# Patient Record
Sex: Female | Born: 1935 | Race: White | Hispanic: No | Marital: Married
Health system: Southern US, Community
[De-identification: ages and names within clinical notes are randomized; demographics above are authoritative.]

## PROBLEM LIST (undated history)

## (undated) DIAGNOSIS — M069 Rheumatoid arthritis, unspecified: Secondary | ICD-10-CM

---

## 1998-11-08 ENCOUNTER — Ambulatory Visit (HOSPITAL_COMMUNITY): Admission: RE | Admit: 1998-11-08 | Discharge: 1998-11-08 | Payer: Self-pay | Admitting: Specialist

## 2000-08-11 ENCOUNTER — Ambulatory Visit (HOSPITAL_COMMUNITY): Admission: RE | Admit: 2000-08-11 | Discharge: 2000-08-11 | Payer: Self-pay | Admitting: Specialist

## 2001-11-11 ENCOUNTER — Encounter
Admission: RE | Admit: 2001-11-11 | Discharge: 2001-12-10 | Payer: Self-pay | Admitting: Physical Medicine and Rehabilitation

## 2005-09-18 ENCOUNTER — Ambulatory Visit: Payer: Self-pay | Admitting: Infectious Diseases

## 2005-09-24 ENCOUNTER — Inpatient Hospital Stay (HOSPITAL_COMMUNITY): Admission: AD | Admit: 2005-09-24 | Discharge: 2005-09-29 | Payer: Self-pay | Admitting: Internal Medicine

## 2009-11-17 ENCOUNTER — Ambulatory Visit (HOSPITAL_COMMUNITY): Admission: RE | Admit: 2009-11-17 | Discharge: 2009-11-17 | Payer: Self-pay | Admitting: Cardiovascular Disease

## 2009-11-17 ENCOUNTER — Ambulatory Visit: Payer: Self-pay | Admitting: Cardiovascular Disease

## 2009-11-21 ENCOUNTER — Ambulatory Visit (HOSPITAL_COMMUNITY)
Admission: RE | Admit: 2009-11-21 | Discharge: 2009-11-21 | Payer: PRIVATE HEALTH INSURANCE | Admitting: Internal Medicine

## 2010-08-24 NOTE — H&P (Signed)
NAMEJAYDALEE, [name]            ACCOUNT NO.:  [id number]   MEDICAL RECORD NO.:  [id number]          PATIENT TYPE:  INP   LOCATION:  3010                         FACILITY:  [facility]   PHYSICIAN:  Larina Earthly, M.D.        DATE OF BIRTH:  November 23, 1929   DATE OF ADMISSION:  09/24/2005  DATE OF DISCHARGE:                                HISTORY & PHYSICAL   CHIEF COMPLAINT:  Left forehead blister associated with significant  erythema, fevers, chills, anorexia and decreased urine output.   HISTORY OF PRESENT ILLNESS:  This is a 77 year old Caucasian female who has  a history of low-dose prednisone dependent rheumatoid arthritis, ongoing  tobacco abuse, gastroesophageal reflux disease, depression, osteoporosis,  hypertension and a recent urinary tract infection growing out Salmonella  group E.  For the latter, she was treated with Cipro 250 mg p.o. b.i.d. x14  days starting on September 12, 2005.  The latter was also discussed with Dr. Maurice March  who felt that she was most probably colonized in her GI tract with the  Salmonella in that there was no need at this time to report this finding to  the local Health Department given that it was not a GI pathogens at this  point.   On September 20, 2005, the patient noted a blister on the left forehead without  any discomfort but continued to watch this lesion.  She felt that it may be  related to the previously mentioned Cipro and this was discontinued.  Over  the next 2-3 days, this was followed by advancing erythema over the left  forehead with associated with subjective chills, malaise, anorexia and  decreased urine output despite adequate fluid intake.  Symptoms progressed  on June 17 and June 18 such that the erythema covered half of the left side  of the forehead and the tissue surrounding the left orbit with some  irritation, decreased vision on the left side.  The patient was offered an  office visit by our practice on September 23, 2005 but the patient  declined.  Today, the patient was seen as a work-in evaluation where she was claimed  that she was quite weak from the anorexia, despite her water intake and she  continued to have some subjective chills, albeit none in the last 12-20  hours.  Given the severity of her erythema, which was not well demarcated  nor did it have the classic appearance of shingles at the time, this was  discussed at length with Dr. Randon Goldsmith partner, Dr. Dillon Bjork who recommended  Ciloxan ointment  as well as Valtrex treatment orally and close follow up in  his office to involve a slit lamp evaluation.  He did prefer to see the  patient on an outpatient basis as opposed to in the hospital given the lack  of proper equipment in the hospital setting.  Given that the patient is  elderly, lives with her husband who is quite frail and her complaints of  anorexia and decreased urine output, I opted to admit the patient for IV  antibiotics as well as antiviral therapy with close follow-up  in evaluation.  I did discuss the patient with Dr. Maurice March who also agreed with starting broad-  spectrum antibiotics to include Unasyn as well as vancomycin just in case  this is community-acquired MRSA.   REVIEW OF SYSTEMS:  As above.   PROBLEM LIST:  1.  Rheumatoid arthritis for approximately 20 years with significant      involvement of the hands, knees and now spinal stenosis related to      underlying degenerative joint disease.  She had deferred demargination-      type therapy for numerous years and indeed after seeing two different      rheumatologist within [facility] is currently on prednisone 5 mg each      day with adequate decreases in her active synovitis.  2.  Head tremor, chronic.  3.  Ongoing tobacco abuse with chronic obstructive pulmonary disease by      chest radiological evaluation.  4.  Gastroesophageal reflux disease with a history of H.  Pylori treatment.  5.  Cataracts treated by Dr. Hazle Quant.  6.  Depression.   7.  History of total abdominal hysterectomy.  8.  Osteoporosis.  9.  Hypertension.  10. Spinal stenosis.   SOCIAL HISTORY:  The patient is married for approximately 52 years, has two  grandchildren and one deceased child as well as two supportive nieces.  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  Has no significant use of alcohol.   FAMILY HISTORY:  Father died at the age of 18 of a brain disorder.  Mother  died at the age of 1 of Parkinson's disease.  The patient does have a  sister who is alive and well.   CURRENT MEDICATIONS:  1.  Restoril 15 mg one p.o. q.h.s.  2.  Effexor XR 75 mg p.o. q.a.m.  3.  Prednisone 5 mg p.o. q.a.m.  4.  Hydrochlorothiazide 25 mg p.o. daily.  5.  Xanax 0.5 mg p.o. b.i.d.  6.  Zantac 150 mg p.o. b.i.d.  7.  Benicar/HCT 40/12.5 1 p.o. daily.   ALLERGIES:  1.  The patient has allergies to PENICILLIN resulting in skin reactions.  2.  ALEVE resulting in hives.  3.  PROZAC resulting in hives.  4.  AVELOX resulting in anxiety.  5.  LEVAQUIN being tolerated.   LABORATORY EVALUATION:  Pending are blood cultures x2, urinalysis with  culture.  If indicated CMET and CBC.   PHYSICAL EXAM:  In general, we have a Caucasian female sitting in a  wheelchair answering all questions appropriately.  Alert and oriented x3 in  no respiratory distress.  Blood pressure 110/68, pulse 56.  The patient is  afebrile.  Respirations were 18 and nonlabored.  Sclerae anicteric with some  mild conjunctivitis on the left.  There is a significant periorbital  swelling of the soft tissues.  Extraocular movements are intact.  There is  significant erythema extending over the whole left side of the forehead but  not into the scalp area which is quite confluent without patches and there is one large ulcer status post blister formation in the  middle of the left  forehead with yellow crust that is significant.  There is no active  discharge at this time.  There is no oropharyngeal lesions.  Tympanic  membranes are clear bilaterally.  Neck is supple.  There is no cervical  lymphadenopathy.  Lungs are clear to auscultation bilaterally.  Cardiovascular exam reveals a regular rate and rhythm.  There is no axillary  lymphadenopathy.  Abdominal exam reveals a soft, nontender, nondistended  abdomen.  Bowel sounds are present.  Extremity exam reveals trace edema.  Pedal pulses are intact.  Musculoskeletal exam reveals significant  osteoarthritic as well as rheumatoid arthritic-type changes with no active  synovitis.   ASSESSMENT/PLAN:  1.  Left facial cellulitis with associated ulcer/blister and periocular      erythema and swelling:  Discussed both with Dr. Dillon Bjork and Dr. Maurice March.  We      will start broad-spectrum antibiotics to include vancomycin as well as      antiviral therapy as outlined above.  We will follow up on cultures and      sensitivities.  Dr. Dillon Bjork will consult within the hospital if indicated      as stated above.  Dr. Maurice March will also consult and make appropriate      recommendations.  2.  Rheumatoid arthritis that is prednisone dependent:  We will monitor      hemodynamic status and use stress dose steroids if the patient becomes      hemodynamically unstable.  3.  History of Salmonella urinary tract infection asymptomatic at this time      with decreased urine output is concerning:  We will check urinalysis and      evaluate renal parameters.  We will provide IV fluids and monitor      appropriately.  4.  Hypertension:  We will continue angiotensin receptive blockade and hold      diuretics including hydrochlorothiazide.  5.  Gastroesophageal reflux disease with continue proton pump inhibitor.  6.  Tobacco abuse:  Discussed with the patient and may need nicotine patch      if indicated.      Larina Earthly, M.D.   Electronically Signed     RA/MEDQ  D:  09/24/2005  T:  09/24/2005  Job:  [id number]   cc:   Fransisco Hertz, M.D.  Fax: [phone]   Lilian Kapur. Dillon Bjork, M.D.  Fax: [phone]

## 2010-08-24 NOTE — Discharge Summary (Signed)
[name], [name]            ACCOUNT NO.:  [id number]   MEDICAL RECORD NO.:  [id number]          PATIENT TYPE:  INP   LOCATION:  3010                         FACILITY:  [facility]   PHYSICIAN:  Larina Earthly, M.D.        DATE OF BIRTH:  Jan 31, 1930   DATE OF ADMISSION:  09/24/2005  DATE OF DISCHARGE:  09/29/2005                                 DISCHARGE SUMMARY   DISCHARGE DIAGNOSES:  1. Left facial cellulitis primarily involving the forehead and periorbital      region possibly secondary to zoster with secondary infection without      systemic spread treated with vancomycin, Valtrex, __________  ointment      and Unasyn, but progressive improvement during hospitalization and      transferred to oral antibiotics.  2. Periorbital swelling secondary to #1 with outpatient follow-up of the      ocular issues by Dr. Dillon Bjork who is an associate of Dr. Hazle Quant.  3. Urinary retention with increased postvoid residual initiated on Flomax      and followed by Dr. Brunilda Payor with resolution of major symptoms prior to      discharge and discontinuation of Foley.  4. Hypertension, controlled.  5. Anorexia, improved with correction of electrolytes and on a proton-pump      inhibitor.  6. Electrolyte abnormalities on admission, resolved with Foley placement      and relief of urinary retention.  7. Anxiety disorder, stable.   SECONDARY DIAGNOSES:  1. Rheumatoid arthritis.  2. Gastroesophageal reflux disease.  3. Depression.   DISCHARGE MEDICATIONS:  1. Flomax 0.4 mg p.o. every evening.  2. Valtrex 1000 mg three times a day to complete a seven-day course.  3. Ciloxan eye drops one drop four times a day for seven days.  4. Doxycycline 100 mg b.i.d. for 10 days.  5. Restoril 15 mg each evening.  6. Effexor XR 75 mg each day.  7. Prednisone 5 mg each day.  8. Hydrochlorothiazide 25 mg daily.  9. Xanax 0.5 mg b.i.d.  10.Zantac 150 mg p.o. b.i.d.  11.Benicar/HCT 40/12.5 once daily.   LABORATORY  EVALUATION:  On admission the white blood cell count was 8.5 and  on discharge was 9.3, hemoglobin 12.7 decreasing to 10.6 with IV fluids,  platelet counts in the 200s.  Admission sodium was 118 with potassium of  3.1, the BUN 6, creatinine 0.6, and liver function tests being normal.  On  discharge sodium was 132, potassium 3.8, BUN 6, creatinine 0.7.  Urinalysis  with potential issues with infection with large amount of leukocyte  esterase, but many epithelial cells and many bacteria as well as leukorrhea.  Urine culture did grow out multiple bacterial morphotypes with none  predominant.  Blood cultures x2 were unremarkable obtained on admission.  The patient on discharge was instructed to follow up with Dr. Felipa Eth in  approximately one week and Dr. Vernie Ammons in approximately one week and to call  if problems.   HISTORY OF THE PRESENT ILLNESS:  This is a 77 year old Caucasian female with  the above-mentioned problem list on low-dose prednisone for rheumatoid  arthritis and ongoing tobacco abuse who presented with a left forehead  blister associated with significant erythema, fevers, chills, anorexia, and  decreased urine output.  She was subsequently admitted after consultation or  discussion with infectious disease as well as ophthalmology given the  clinical diagnosis of possible zoster and left facial cellulitis.  She was  placed on broad-spectrum antibiotics and antiviral agents as discussed above  and in the history and physical and admitted for further evaluation and  management.   HOSPITAL COURSE:  The patient quickly improved over the next two to three  days with improvement in the appearance of her left facial cellulitis.  She  tolerated her IV antibiotics well and her visual complaints thus slightly  improved gradually.  Her electrolytes resolved with IV fluids and placement  of a Foley catheter for urine retention.  She was seen by urology who  recommended initiating Flomax with  gradual resolution of her bladder  retention symptoms.  She was transferred over to oral doxycycline during her  hospitalization as well as completed Valtrex treatment and was thought  appropriate for discharge on September 29, 2005.      Larina Earthly, M.D.  Electronically Signed     RA/MEDQ  D:  11/14/2005  T:  11/14/2005  Job:  [id number]

## 2010-08-24 NOTE — Consult Note (Signed)
[name], [name]            ACCOUNT NO.:  [id number]   MEDICAL RECORD NO.:  [id number]          PATIENT TYPE:  INP   LOCATION:  3010                         FACILITY:  [facility]   PHYSICIAN:  Mark C. Vernie Ammons, M.D.  DATE OF BIRTH:  [birth year]   DATE OF CONSULTATION:  09/26/2005  DATE OF DISCHARGE:                                   CONSULTATION   ROOM NUMBER:  [id number].   HISTORY OF PRESENT ILLNESS:  The patient is a 77 year old white female seen  in hospital consultation for acute urinary retention. She reports having had  what sounds like some voiding difficulty for several months initially,  consisting of dysuria, frequency, and urgency, but only voiding a small  amount at a time. She did report having hesitancy when she did attempt to  void and would push on her lower abdomen in order to attempt to empty her  bladder. She felt like she was not completely emptying her bladder. She has  no history of stones or hematuria. She reports she was put in the hospital  and underwent cystoscopy for bladder problems that she does not know the  nature of, when she was very young. She also gives a history of being  treated recently for a urinary tract infection. Prior to [facility], there  had been what appeared to be decreased urine output.   PAST MEDICAL HISTORY:  1.  Rheumatoid arthritis.  2.  Chronic head tremor.  3.  Tobacco abuse.  4.  Chronic obstructive pulmonary disease.  5.  Gastroesophageal reflux.  6.  Cataracts.  7.  Depression.  8.  Osteoporosis.  9.  Hypertension.  10. Spinal stenosis.   PAST SURGICAL HISTORY:  She had a hysterectomy and has had breast surgery.   ALLERGIES:  PENICILLIN, ALEVE, PROZAC, AVELOX, LEVAQUIN.   MEDICATIONS:  Restoril, Effexor, prednisone, hydrochlorothiazide, Xanax,  Zantac, Benicar/HCTZ.   SOCIAL HISTORY:  The patient smokes a pack and a half of cigarettes a day  for approximately 50 years. Denies any significant alcohol use.   FAMILY  HISTORY:  Father died at 38 of a brain disorder. Mother died at 1 of  Parkinson's.   REVIEW OF SYSTEMS:  Per health history section 2, which was reviewed and  signed.   PHYSICAL EXAMINATION:  GENERAL:  The patient is an elderly white female with  an obvious head tremor.  HEENT:  Normocephalic and atraumatic. Oropharynx clear. She does have what  appears to be some mild swelling and erythema in the periorbital region and  supraorbital region on the left hand side.  VITAL SIGNS:  Blood pressure is 137/69, pulse 76, temperature 97.4.  NECK:  Supple with midline trachea.  CHEST:  Normal inspiratory effort.  CARDIOVASCULAR:  Regular rate and rhythm.  ABDOMEN:  Soft, nontender, with mass or HSM.  GENITOURINARY:  Normal external female genitalia with a Foley catheter  indwelling.  RECTAL:  She has normal anus and perineum.  EXTREMITIES:  Without clubbing, cyanosis, or edema.  NEUROLOGIC:  Alert and oriented with appropriate mood and affect.   LABORATORY DATA:  Creatinine 0.7. Urinalysis on September 24, 2005 with  positive  for leukocyte esterase and had 11 to 20 white blood cells and no red blood  cells and many bacteria. On September 25, 2005, urinalysis was repeated and was  completely negative. A urine culture was obtained but remains pending at  this time.   IMPRESSION:  Long standing voiding dysfunction by the patient's history and  what sounds like a recent worsening with resultant urinary retention and  possibly urinary tract infection.   RECOMMENDATIONS:  1.  Continue Foley catheter drainage for now.  2.  Await urine culture results.  3.  Begin enteric Flomax 0.4 mg.  4.  Voiding trial in 48 hours.  5.  Once she is voiding spontaneously without a Foley catheter, I will need      to do Urodynamics in my office, as an outpatient.      Mark C. Vernie Ammons, M.D.  Electronically Signed     MCO/MEDQ  D:  09/26/2005  T:  09/27/2005  Job:  [id number]

## 2011-08-26 DIAGNOSIS — R259 Unspecified abnormal involuntary movements: Secondary | ICD-10-CM | POA: Diagnosis not present

## 2011-08-26 DIAGNOSIS — M069 Rheumatoid arthritis, unspecified: Secondary | ICD-10-CM | POA: Diagnosis not present

## 2011-08-26 DIAGNOSIS — M810 Age-related osteoporosis without current pathological fracture: Secondary | ICD-10-CM | POA: Diagnosis not present

## 2011-08-26 DIAGNOSIS — I10 Essential (primary) hypertension: Secondary | ICD-10-CM | POA: Diagnosis not present

## 2012-02-26 DIAGNOSIS — I10 Essential (primary) hypertension: Secondary | ICD-10-CM | POA: Diagnosis not present

## 2012-02-26 DIAGNOSIS — Z23 Encounter for immunization: Secondary | ICD-10-CM | POA: Diagnosis not present

## 2012-02-26 DIAGNOSIS — Z1331 Encounter for screening for depression: Secondary | ICD-10-CM | POA: Diagnosis not present

## 2012-02-26 DIAGNOSIS — M069 Rheumatoid arthritis, unspecified: Secondary | ICD-10-CM | POA: Diagnosis not present

## 2012-02-26 DIAGNOSIS — M810 Age-related osteoporosis without current pathological fracture: Secondary | ICD-10-CM | POA: Diagnosis not present

## 2012-02-26 DIAGNOSIS — F329 Major depressive disorder, single episode, unspecified: Secondary | ICD-10-CM | POA: Diagnosis not present

## 2012-08-25 DIAGNOSIS — K5900 Constipation, unspecified: Secondary | ICD-10-CM | POA: Diagnosis not present

## 2012-08-25 DIAGNOSIS — R259 Unspecified abnormal involuntary movements: Secondary | ICD-10-CM | POA: Diagnosis not present

## 2012-08-25 DIAGNOSIS — I10 Essential (primary) hypertension: Secondary | ICD-10-CM | POA: Diagnosis not present

## 2012-08-25 DIAGNOSIS — M810 Age-related osteoporosis without current pathological fracture: Secondary | ICD-10-CM | POA: Diagnosis not present

## 2012-08-25 DIAGNOSIS — F329 Major depressive disorder, single episode, unspecified: Secondary | ICD-10-CM | POA: Diagnosis not present

## 2012-08-25 DIAGNOSIS — M069 Rheumatoid arthritis, unspecified: Secondary | ICD-10-CM | POA: Diagnosis not present

## 2012-08-25 DIAGNOSIS — R7301 Impaired fasting glucose: Secondary | ICD-10-CM | POA: Diagnosis not present

## 2012-08-25 DIAGNOSIS — F17200 Nicotine dependence, unspecified, uncomplicated: Secondary | ICD-10-CM | POA: Diagnosis not present

## 2013-02-22 DIAGNOSIS — M069 Rheumatoid arthritis, unspecified: Secondary | ICD-10-CM | POA: Diagnosis not present

## 2013-02-22 DIAGNOSIS — Z6830 Body mass index (BMI) 30.0-30.9, adult: Secondary | ICD-10-CM | POA: Diagnosis not present

## 2013-02-22 DIAGNOSIS — F17200 Nicotine dependence, unspecified, uncomplicated: Secondary | ICD-10-CM | POA: Diagnosis not present

## 2013-02-22 DIAGNOSIS — I10 Essential (primary) hypertension: Secondary | ICD-10-CM | POA: Diagnosis not present

## 2013-02-22 DIAGNOSIS — M1990 Unspecified osteoarthritis, unspecified site: Secondary | ICD-10-CM | POA: Diagnosis not present

## 2013-02-22 DIAGNOSIS — J069 Acute upper respiratory infection, unspecified: Secondary | ICD-10-CM | POA: Diagnosis not present

## 2013-02-22 DIAGNOSIS — Z1331 Encounter for screening for depression: Secondary | ICD-10-CM | POA: Diagnosis not present

## 2013-02-22 DIAGNOSIS — R7301 Impaired fasting glucose: Secondary | ICD-10-CM | POA: Diagnosis not present

## 2013-08-23 DIAGNOSIS — K219 Gastro-esophageal reflux disease without esophagitis: Secondary | ICD-10-CM | POA: Diagnosis not present

## 2013-08-23 DIAGNOSIS — F3289 Other specified depressive episodes: Secondary | ICD-10-CM | POA: Diagnosis not present

## 2013-08-23 DIAGNOSIS — M810 Age-related osteoporosis without current pathological fracture: Secondary | ICD-10-CM | POA: Diagnosis not present

## 2013-08-23 DIAGNOSIS — M1990 Unspecified osteoarthritis, unspecified site: Secondary | ICD-10-CM | POA: Diagnosis not present

## 2013-08-23 DIAGNOSIS — K5900 Constipation, unspecified: Secondary | ICD-10-CM | POA: Diagnosis not present

## 2013-08-23 DIAGNOSIS — M069 Rheumatoid arthritis, unspecified: Secondary | ICD-10-CM | POA: Diagnosis not present

## 2013-08-23 DIAGNOSIS — F329 Major depressive disorder, single episode, unspecified: Secondary | ICD-10-CM | POA: Diagnosis not present

## 2013-08-23 DIAGNOSIS — R7301 Impaired fasting glucose: Secondary | ICD-10-CM | POA: Diagnosis not present

## 2013-08-23 DIAGNOSIS — R259 Unspecified abnormal involuntary movements: Secondary | ICD-10-CM | POA: Diagnosis not present

## 2013-08-23 DIAGNOSIS — I10 Essential (primary) hypertension: Secondary | ICD-10-CM | POA: Diagnosis not present

## 2013-08-23 DIAGNOSIS — Z1331 Encounter for screening for depression: Secondary | ICD-10-CM | POA: Diagnosis not present

## 2014-02-23 DIAGNOSIS — R7301 Impaired fasting glucose: Secondary | ICD-10-CM | POA: Diagnosis not present

## 2014-02-23 DIAGNOSIS — I10 Essential (primary) hypertension: Secondary | ICD-10-CM | POA: Diagnosis not present

## 2014-02-23 DIAGNOSIS — F17200 Nicotine dependence, unspecified, uncomplicated: Secondary | ICD-10-CM | POA: Diagnosis not present

## 2014-02-23 DIAGNOSIS — K219 Gastro-esophageal reflux disease without esophagitis: Secondary | ICD-10-CM | POA: Diagnosis not present

## 2014-02-23 DIAGNOSIS — M069 Rheumatoid arthritis, unspecified: Secondary | ICD-10-CM | POA: Diagnosis not present

## 2014-02-23 DIAGNOSIS — M859 Disorder of bone density and structure, unspecified: Secondary | ICD-10-CM | POA: Diagnosis not present

## 2014-02-23 DIAGNOSIS — R259 Unspecified abnormal involuntary movements: Secondary | ICD-10-CM | POA: Diagnosis not present

## 2014-02-23 DIAGNOSIS — Z23 Encounter for immunization: Secondary | ICD-10-CM | POA: Diagnosis not present

## 2014-02-23 DIAGNOSIS — F329 Major depressive disorder, single episode, unspecified: Secondary | ICD-10-CM | POA: Diagnosis not present

## 2014-02-23 DIAGNOSIS — Z1389 Encounter for screening for other disorder: Secondary | ICD-10-CM | POA: Diagnosis not present

## 2015-01-10 DIAGNOSIS — Z23 Encounter for immunization: Secondary | ICD-10-CM | POA: Diagnosis not present

## 2015-01-10 DIAGNOSIS — M810 Age-related osteoporosis without current pathological fracture: Secondary | ICD-10-CM | POA: Diagnosis not present

## 2015-01-10 DIAGNOSIS — K219 Gastro-esophageal reflux disease without esophagitis: Secondary | ICD-10-CM | POA: Diagnosis not present

## 2015-01-10 DIAGNOSIS — Z6830 Body mass index (BMI) 30.0-30.9, adult: Secondary | ICD-10-CM | POA: Diagnosis not present

## 2015-01-10 DIAGNOSIS — R7301 Impaired fasting glucose: Secondary | ICD-10-CM | POA: Diagnosis not present

## 2015-01-10 DIAGNOSIS — F329 Major depressive disorder, single episode, unspecified: Secondary | ICD-10-CM | POA: Diagnosis not present

## 2015-01-10 DIAGNOSIS — I10 Essential (primary) hypertension: Secondary | ICD-10-CM | POA: Diagnosis not present

## 2015-01-10 DIAGNOSIS — M069 Rheumatoid arthritis, unspecified: Secondary | ICD-10-CM | POA: Diagnosis not present

## 2015-01-10 DIAGNOSIS — M1990 Unspecified osteoarthritis, unspecified site: Secondary | ICD-10-CM | POA: Diagnosis not present

## 2015-01-10 DIAGNOSIS — K5900 Constipation, unspecified: Secondary | ICD-10-CM | POA: Diagnosis not present

## 2015-07-11 DIAGNOSIS — R7301 Impaired fasting glucose: Secondary | ICD-10-CM | POA: Diagnosis not present

## 2015-07-11 DIAGNOSIS — F329 Major depressive disorder, single episode, unspecified: Secondary | ICD-10-CM | POA: Diagnosis not present

## 2015-07-11 DIAGNOSIS — I10 Essential (primary) hypertension: Secondary | ICD-10-CM | POA: Diagnosis not present

## 2015-07-11 DIAGNOSIS — M810 Age-related osteoporosis without current pathological fracture: Secondary | ICD-10-CM | POA: Diagnosis not present

## 2015-07-11 DIAGNOSIS — M069 Rheumatoid arthritis, unspecified: Secondary | ICD-10-CM | POA: Diagnosis not present

## 2015-07-11 DIAGNOSIS — K5900 Constipation, unspecified: Secondary | ICD-10-CM | POA: Diagnosis not present

## 2015-07-11 DIAGNOSIS — Z6830 Body mass index (BMI) 30.0-30.9, adult: Secondary | ICD-10-CM | POA: Diagnosis not present

## 2015-07-11 DIAGNOSIS — K219 Gastro-esophageal reflux disease without esophagitis: Secondary | ICD-10-CM | POA: Diagnosis not present

## 2015-10-25 DIAGNOSIS — Z6830 Body mass index (BMI) 30.0-30.9, adult: Secondary | ICD-10-CM | POA: Diagnosis not present

## 2015-10-25 DIAGNOSIS — M069 Rheumatoid arthritis, unspecified: Secondary | ICD-10-CM | POA: Diagnosis not present

## 2015-10-25 DIAGNOSIS — I10 Essential (primary) hypertension: Secondary | ICD-10-CM | POA: Diagnosis not present

## 2015-10-25 DIAGNOSIS — R300 Dysuria: Secondary | ICD-10-CM | POA: Diagnosis not present

## 2015-10-25 DIAGNOSIS — R7301 Impaired fasting glucose: Secondary | ICD-10-CM | POA: Diagnosis not present

## 2015-10-25 DIAGNOSIS — F329 Major depressive disorder, single episode, unspecified: Secondary | ICD-10-CM | POA: Diagnosis not present

## 2016-02-26 DIAGNOSIS — Z23 Encounter for immunization: Secondary | ICD-10-CM | POA: Diagnosis not present

## 2016-02-26 DIAGNOSIS — I10 Essential (primary) hypertension: Secondary | ICD-10-CM | POA: Diagnosis not present

## 2016-02-26 DIAGNOSIS — F17200 Nicotine dependence, unspecified, uncomplicated: Secondary | ICD-10-CM | POA: Diagnosis not present

## 2016-02-26 DIAGNOSIS — M810 Age-related osteoporosis without current pathological fracture: Secondary | ICD-10-CM | POA: Diagnosis not present

## 2016-02-26 DIAGNOSIS — R7301 Impaired fasting glucose: Secondary | ICD-10-CM | POA: Diagnosis not present

## 2016-02-26 DIAGNOSIS — M0689 Other specified rheumatoid arthritis, multiple sites: Secondary | ICD-10-CM | POA: Diagnosis not present

## 2016-02-26 DIAGNOSIS — Z6829 Body mass index (BMI) 29.0-29.9, adult: Secondary | ICD-10-CM | POA: Diagnosis not present

## 2016-02-26 DIAGNOSIS — K219 Gastro-esophageal reflux disease without esophagitis: Secondary | ICD-10-CM | POA: Diagnosis not present

## 2016-02-26 DIAGNOSIS — F329 Major depressive disorder, single episode, unspecified: Secondary | ICD-10-CM | POA: Diagnosis not present

## 2016-11-06 DIAGNOSIS — Z6828 Body mass index (BMI) 28.0-28.9, adult: Secondary | ICD-10-CM | POA: Diagnosis not present

## 2016-11-06 DIAGNOSIS — M069 Rheumatoid arthritis, unspecified: Secondary | ICD-10-CM | POA: Diagnosis not present

## 2016-11-06 DIAGNOSIS — K219 Gastro-esophageal reflux disease without esophagitis: Secondary | ICD-10-CM | POA: Diagnosis not present

## 2016-11-06 DIAGNOSIS — M810 Age-related osteoporosis without current pathological fracture: Secondary | ICD-10-CM | POA: Diagnosis not present

## 2016-11-06 DIAGNOSIS — F3289 Other specified depressive episodes: Secondary | ICD-10-CM | POA: Diagnosis not present

## 2016-11-06 DIAGNOSIS — I10 Essential (primary) hypertension: Secondary | ICD-10-CM | POA: Diagnosis not present

## 2016-11-06 DIAGNOSIS — F17200 Nicotine dependence, unspecified, uncomplicated: Secondary | ICD-10-CM | POA: Diagnosis not present

## 2016-11-06 DIAGNOSIS — R7301 Impaired fasting glucose: Secondary | ICD-10-CM | POA: Diagnosis not present

## 2017-02-25 DIAGNOSIS — I10 Essential (primary) hypertension: Secondary | ICD-10-CM | POA: Diagnosis not present

## 2017-02-25 DIAGNOSIS — Z23 Encounter for immunization: Secondary | ICD-10-CM | POA: Diagnosis not present

## 2017-03-25 DIAGNOSIS — R7301 Impaired fasting glucose: Secondary | ICD-10-CM | POA: Diagnosis not present

## 2017-03-25 DIAGNOSIS — M810 Age-related osteoporosis without current pathological fracture: Secondary | ICD-10-CM | POA: Diagnosis not present

## 2017-03-25 DIAGNOSIS — F17200 Nicotine dependence, unspecified, uncomplicated: Secondary | ICD-10-CM | POA: Diagnosis not present

## 2017-03-25 DIAGNOSIS — Z1389 Encounter for screening for other disorder: Secondary | ICD-10-CM | POA: Diagnosis not present

## 2017-03-25 DIAGNOSIS — F3289 Other specified depressive episodes: Secondary | ICD-10-CM | POA: Diagnosis not present

## 2017-03-25 DIAGNOSIS — M069 Rheumatoid arthritis, unspecified: Secondary | ICD-10-CM | POA: Diagnosis not present

## 2017-03-25 DIAGNOSIS — K219 Gastro-esophageal reflux disease without esophagitis: Secondary | ICD-10-CM | POA: Diagnosis not present

## 2017-03-25 DIAGNOSIS — Z6828 Body mass index (BMI) 28.0-28.9, adult: Secondary | ICD-10-CM | POA: Diagnosis not present

## 2017-03-25 DIAGNOSIS — I10 Essential (primary) hypertension: Secondary | ICD-10-CM | POA: Diagnosis not present

## 2017-09-15 DIAGNOSIS — F17200 Nicotine dependence, unspecified, uncomplicated: Secondary | ICD-10-CM | POA: Diagnosis not present

## 2017-09-15 DIAGNOSIS — M810 Age-related osteoporosis without current pathological fracture: Secondary | ICD-10-CM | POA: Diagnosis not present

## 2017-09-15 DIAGNOSIS — R7301 Impaired fasting glucose: Secondary | ICD-10-CM | POA: Diagnosis not present

## 2017-09-15 DIAGNOSIS — F329 Major depressive disorder, single episode, unspecified: Secondary | ICD-10-CM | POA: Diagnosis not present

## 2017-09-15 DIAGNOSIS — K219 Gastro-esophageal reflux disease without esophagitis: Secondary | ICD-10-CM | POA: Diagnosis not present

## 2017-09-15 DIAGNOSIS — M069 Rheumatoid arthritis, unspecified: Secondary | ICD-10-CM | POA: Diagnosis not present

## 2017-09-15 DIAGNOSIS — Z6828 Body mass index (BMI) 28.0-28.9, adult: Secondary | ICD-10-CM | POA: Diagnosis not present

## 2017-09-15 DIAGNOSIS — I10 Essential (primary) hypertension: Secondary | ICD-10-CM | POA: Diagnosis not present

## 2017-09-15 DIAGNOSIS — Z1389 Encounter for screening for other disorder: Secondary | ICD-10-CM | POA: Diagnosis not present

## 2018-10-27 DIAGNOSIS — I10 Essential (primary) hypertension: Secondary | ICD-10-CM | POA: Diagnosis not present

## 2018-10-27 DIAGNOSIS — M069 Rheumatoid arthritis, unspecified: Secondary | ICD-10-CM | POA: Diagnosis not present

## 2018-10-27 DIAGNOSIS — K219 Gastro-esophageal reflux disease without esophagitis: Secondary | ICD-10-CM | POA: Diagnosis not present

## 2018-10-27 DIAGNOSIS — F17200 Nicotine dependence, unspecified, uncomplicated: Secondary | ICD-10-CM | POA: Diagnosis not present

## 2018-10-27 DIAGNOSIS — R7301 Impaired fasting glucose: Secondary | ICD-10-CM | POA: Diagnosis not present

## 2018-10-27 DIAGNOSIS — Z1331 Encounter for screening for depression: Secondary | ICD-10-CM | POA: Diagnosis not present

## 2018-10-27 DIAGNOSIS — F329 Major depressive disorder, single episode, unspecified: Secondary | ICD-10-CM | POA: Diagnosis not present

## 2018-10-27 DIAGNOSIS — M810 Age-related osteoporosis without current pathological fracture: Secondary | ICD-10-CM | POA: Diagnosis not present

## 2018-12-02 DIAGNOSIS — Z8669 Personal history of other diseases of the nervous system and sense organs: Secondary | ICD-10-CM | POA: Diagnosis not present

## 2018-12-02 DIAGNOSIS — I10 Essential (primary) hypertension: Secondary | ICD-10-CM | POA: Diagnosis not present

## 2018-12-02 DIAGNOSIS — M069 Rheumatoid arthritis, unspecified: Secondary | ICD-10-CM | POA: Diagnosis not present

## 2018-12-02 DIAGNOSIS — R259 Unspecified abnormal involuntary movements: Secondary | ICD-10-CM | POA: Diagnosis not present

## 2018-12-02 DIAGNOSIS — R296 Repeated falls: Secondary | ICD-10-CM | POA: Diagnosis not present

## 2018-12-02 DIAGNOSIS — M1990 Unspecified osteoarthritis, unspecified site: Secondary | ICD-10-CM | POA: Diagnosis not present

## 2018-12-02 DIAGNOSIS — M810 Age-related osteoporosis without current pathological fracture: Secondary | ICD-10-CM | POA: Diagnosis not present

## 2019-04-30 DIAGNOSIS — F17200 Nicotine dependence, unspecified, uncomplicated: Secondary | ICD-10-CM | POA: Diagnosis not present

## 2019-04-30 DIAGNOSIS — I10 Essential (primary) hypertension: Secondary | ICD-10-CM | POA: Diagnosis not present

## 2019-04-30 DIAGNOSIS — R7301 Impaired fasting glucose: Secondary | ICD-10-CM | POA: Diagnosis not present

## 2019-04-30 DIAGNOSIS — F329 Major depressive disorder, single episode, unspecified: Secondary | ICD-10-CM | POA: Diagnosis not present

## 2019-04-30 DIAGNOSIS — R259 Unspecified abnormal involuntary movements: Secondary | ICD-10-CM | POA: Diagnosis not present

## 2019-04-30 DIAGNOSIS — M069 Rheumatoid arthritis, unspecified: Secondary | ICD-10-CM | POA: Diagnosis not present

## 2019-04-30 DIAGNOSIS — M810 Age-related osteoporosis without current pathological fracture: Secondary | ICD-10-CM | POA: Diagnosis not present

## 2019-04-30 DIAGNOSIS — K5900 Constipation, unspecified: Secondary | ICD-10-CM | POA: Diagnosis not present

## 2019-04-30 DIAGNOSIS — R296 Repeated falls: Secondary | ICD-10-CM | POA: Diagnosis not present

## 2019-10-06 DIAGNOSIS — I959 Hypotension, unspecified: Secondary | ICD-10-CM | POA: Diagnosis not present

## 2019-10-06 DIAGNOSIS — K5900 Constipation, unspecified: Secondary | ICD-10-CM | POA: Diagnosis not present

## 2019-10-06 DIAGNOSIS — R42 Dizziness and giddiness: Secondary | ICD-10-CM | POA: Diagnosis not present

## 2019-10-06 DIAGNOSIS — W19XXXA Unspecified fall, initial encounter: Secondary | ICD-10-CM | POA: Diagnosis not present

## 2019-10-29 DIAGNOSIS — I10 Essential (primary) hypertension: Secondary | ICD-10-CM | POA: Diagnosis not present

## 2019-10-29 DIAGNOSIS — K219 Gastro-esophageal reflux disease without esophagitis: Secondary | ICD-10-CM | POA: Diagnosis not present

## 2019-10-29 DIAGNOSIS — F17200 Nicotine dependence, unspecified, uncomplicated: Secondary | ICD-10-CM | POA: Diagnosis not present

## 2019-10-29 DIAGNOSIS — R7301 Impaired fasting glucose: Secondary | ICD-10-CM | POA: Diagnosis not present

## 2019-10-29 DIAGNOSIS — M069 Rheumatoid arthritis, unspecified: Secondary | ICD-10-CM | POA: Diagnosis not present

## 2019-10-29 DIAGNOSIS — F329 Major depressive disorder, single episode, unspecified: Secondary | ICD-10-CM | POA: Diagnosis not present

## 2019-10-29 DIAGNOSIS — R259 Unspecified abnormal involuntary movements: Secondary | ICD-10-CM | POA: Diagnosis not present

## 2019-11-05 DIAGNOSIS — R7301 Impaired fasting glucose: Secondary | ICD-10-CM | POA: Diagnosis not present

## 2019-11-05 DIAGNOSIS — I10 Essential (primary) hypertension: Secondary | ICD-10-CM | POA: Diagnosis not present

## 2020-05-05 DIAGNOSIS — R259 Unspecified abnormal involuntary movements: Secondary | ICD-10-CM | POA: Diagnosis not present

## 2020-05-05 DIAGNOSIS — F17200 Nicotine dependence, unspecified, uncomplicated: Secondary | ICD-10-CM | POA: Diagnosis not present

## 2020-05-05 DIAGNOSIS — K219 Gastro-esophageal reflux disease without esophagitis: Secondary | ICD-10-CM | POA: Diagnosis not present

## 2020-05-05 DIAGNOSIS — R7301 Impaired fasting glucose: Secondary | ICD-10-CM | POA: Diagnosis not present

## 2020-05-05 DIAGNOSIS — F329 Major depressive disorder, single episode, unspecified: Secondary | ICD-10-CM | POA: Diagnosis not present

## 2020-05-05 DIAGNOSIS — I10 Essential (primary) hypertension: Secondary | ICD-10-CM | POA: Diagnosis not present

## 2020-05-05 DIAGNOSIS — M069 Rheumatoid arthritis, unspecified: Secondary | ICD-10-CM | POA: Diagnosis not present

## 2020-07-02 ENCOUNTER — Emergency Department (HOSPITAL_COMMUNITY)
Admission: EM | Admit: 2020-07-02 | Discharge: 2020-07-04 | Disposition: A | Discharge status: Home / Self Care | Payer: Medicare Other | Attending: Emergency Medicine | Admitting: Emergency Medicine

## 2020-07-02 ENCOUNTER — Emergency Department (HOSPITAL_COMMUNITY): Payer: Medicare Other

## 2020-07-02 ENCOUNTER — Other Ambulatory Visit: Payer: Self-pay

## 2020-07-02 ENCOUNTER — Encounter (HOSPITAL_COMMUNITY): Payer: Self-pay

## 2020-07-02 DIAGNOSIS — M25461 Effusion, right knee: Secondary | ICD-10-CM | POA: Diagnosis not present

## 2020-07-02 DIAGNOSIS — I10 Essential (primary) hypertension: Secondary | ICD-10-CM | POA: Diagnosis not present

## 2020-07-02 DIAGNOSIS — S76811A Strain of other specified muscles, fascia and tendons at thigh level, right thigh, initial encounter: Secondary | ICD-10-CM | POA: Diagnosis not present

## 2020-07-02 DIAGNOSIS — Z23 Encounter for immunization: Secondary | ICD-10-CM | POA: Diagnosis not present

## 2020-07-02 DIAGNOSIS — M25561 Pain in right knee: Secondary | ICD-10-CM | POA: Insufficient documentation

## 2020-07-02 DIAGNOSIS — Z20822 Contact with and (suspected) exposure to covid-19: Secondary | ICD-10-CM | POA: Diagnosis not present

## 2020-07-02 HISTORY — DX: Rheumatoid arthritis, unspecified: M06.9

## 2020-07-02 LAB — CBC WITH DIFFERENTIAL/PLATELET
Abs Immature Granulocytes: 0.05 10*3/uL (ref 0.00–0.07)
Basophils Absolute: 0.1 10*3/uL (ref 0.0–0.1)
Basophils Relative: 0 %
Eosinophils Absolute: 0.1 10*3/uL (ref 0.0–0.5)
Eosinophils Relative: 0 %
HCT: 38.4 % (ref 36.0–46.0)
Hemoglobin: 11.6 g/dL — ABNORMAL LOW (ref 12.0–15.0)
Immature Granulocytes: 0 %
Lymphocytes Relative: 16 %
Lymphs Abs: 2.2 10*3/uL (ref 0.7–4.0)
MCH: 27.8 pg (ref 26.0–34.0)
MCHC: 30.2 g/dL (ref 30.0–36.0)
MCV: 92.1 fL (ref 80.0–100.0)
Monocytes Absolute: 1.6 10*3/uL — ABNORMAL HIGH (ref 0.1–1.0)
Monocytes Relative: 12 %
Neutro Abs: 9.8 10*3/uL — ABNORMAL HIGH (ref 1.7–7.7)
Neutrophils Relative %: 72 %
Platelets: 205 10*3/uL (ref 150–400)
RBC: 4.17 MIL/uL (ref 3.87–5.11)
RDW: 18.9 % — ABNORMAL HIGH (ref 11.5–15.5)
WBC: 13.8 10*3/uL — ABNORMAL HIGH (ref 4.0–10.5)
nRBC: 0.1 % (ref 0.0–0.2)

## 2020-07-02 LAB — COMPREHENSIVE METABOLIC PANEL
ALT: 13 U/L (ref 0–44)
AST: 15 U/L (ref 15–41)
Albumin: 4.1 g/dL (ref 3.5–5.0)
Alkaline Phosphatase: 124 U/L (ref 38–126)
Anion gap: 8 (ref 5–15)
BUN: 18 mg/dL (ref 8–23)
CO2: 26 mmol/L (ref 22–32)
Calcium: 9.4 mg/dL (ref 8.9–10.3)
Chloride: 106 mmol/L (ref 98–111)
Creatinine, Ser: 0.84 mg/dL (ref 0.44–1.00)
GFR, Estimated: >60 mL/min (ref >60)
Glucose, Bld: 126 mg/dL — ABNORMAL HIGH (ref 70–99)
Potassium: 4.1 mmol/L (ref 3.5–5.1)
Sodium: 140 mmol/L (ref 135–145)
Total Bilirubin: 1 mg/dL (ref 0.3–1.2)
Total Protein: 7.2 g/dL (ref 6.5–8.1)

## 2020-07-02 LAB — URINALYSIS, ROUTINE W REFLEX MICROSCOPIC
Bilirubin Urine: NEGATIVE
Glucose, UA: NEGATIVE mg/dL
Ketones, ur: NEGATIVE mg/dL
Nitrite: NEGATIVE
Protein, ur: 30 mg/dL — AB
Specific Gravity, Urine: 1.012 (ref 1.005–1.030)
WBC, UA: >50 WBC/hpf — ABNORMAL HIGH (ref 0–5)
pH: 7 (ref 5.0–8.0)

## 2020-07-02 MED ORDER — PREDNISONE 5 MG PO TABS
5.0000 mg | ORAL_TABLET | Freq: Every day | ORAL | Status: DC
  Administered 2020-07-03 – 2020-07-04 (×2): 5 mg via ORAL
  Filled 2020-07-02 (×3): qty 1

## 2020-07-02 MED ORDER — TEMAZEPAM 15 MG PO CAPS
30.0000 mg | ORAL_CAPSULE | Freq: Every day | ORAL | Status: DC
  Administered 2020-07-02 – 2020-07-03 (×2): 30 mg via ORAL
  Filled 2020-07-02 (×2): qty 2

## 2020-07-02 MED ORDER — OXYCODONE-ACETAMINOPHEN 5-325 MG PO TABS: 1.0000 | ORAL_TABLET | Freq: Four times a day (QID) | ORAL | 0 refills | Status: DC | PRN

## 2020-07-02 MED ORDER — PSYLLIUM 95 % PO PACK
1.0000 | PACK | Freq: Every day | ORAL | Status: DC
  Administered 2020-07-02 – 2020-07-04 (×3): 1 via ORAL
  Filled 2020-07-02 (×3): qty 1

## 2020-07-02 MED ORDER — OXYCODONE-ACETAMINOPHEN 5-325 MG PO TABS
1.0000 | ORAL_TABLET | Freq: Once | ORAL | Status: AC
  Administered 2020-07-02: 1 via ORAL
  Filled 2020-07-02: qty 1

## 2020-07-02 MED ORDER — ALPRAZOLAM 0.5 MG PO TABS
0.5000 mg | ORAL_TABLET | Freq: Three times a day (TID) | ORAL | Status: DC | PRN
  Administered 2020-07-02 – 2020-07-04 (×6): 0.5 mg via ORAL
  Filled 2020-07-02 (×6): qty 1

## 2020-07-02 MED ORDER — VENLAFAXINE HCL ER 75 MG PO CP24
75.0000 mg | ORAL_CAPSULE | Freq: Every day | ORAL | Status: DC
  Administered 2020-07-02 – 2020-07-04 (×3): 75 mg via ORAL
  Filled 2020-07-02 (×3): qty 1

## 2020-07-02 MED ORDER — ACETAMINOPHEN 325 MG PO TABS: 650.0000 mg | ORAL_TABLET | Freq: Four times a day (QID) | ORAL | Status: DC | PRN

## 2020-07-02 NOTE — Progress Notes (Signed)
CSW spoke with Burna Mortimer, RN CM who will arrange [facility] services through [facility]. The agency will contact the patient directly to initiate services.  Edwin Dada, MSW, LCSW Transitions of Care  Clinical Social Worker II [phone]

## 2020-07-02 NOTE — ED Triage Notes (Signed)
Pt BIB [facility] from home for right knee pain since yesterday, thinks she twisted it while ambulating. No hx of right knee pain. Denies falls, trauma, other pain. Able to stand/sit, no obvious deformity.  BP 124/72 HR 90 SpO2 98% RR 18

## 2020-07-02 NOTE — Discharge Instructions (Signed)
Follow-up with your family doctor next week.  Home health will contact you tomorrow to make some arrangements for assisting you at home

## 2020-07-02 NOTE — ED Notes (Signed)
Spoke with grandson Susy Frizzle about patient's care and plan. Patient requesting SNF placement. [facility] made aware and consult.

## 2020-07-02 NOTE — ED Provider Notes (Addendum)
[facility]-EMERGENCY DEPT Provider Note   CSN: [id number] Arrival date & time: 07/02/20  1056     History Chief Complaint  Patient presents with  . Knee Pain    [name] is a 87 y.o. female.  Patient twisted her right knee and has pain in that knee  The history is provided by the patient and medical records. No language interpreter was used.  Knee Pain Lower extremity pain location: Right knee. Injury: no   Pain details:    Quality:  Aching   Radiates to:  Does not radiate   Severity:  Moderate   Onset quality:  Sudden   Timing:  Constant Associated symptoms: no back pain and no fatigue        Past Medical History:  Diagnosis Date  . Rheumatoid arthritis (HCC)     There are no problems to display for this patient.   OB History   No obstetric history on file.     No family history on file.     Home Medications Prior to Admission medications   Medication Sig Start Date End Date Taking? Authorizing Provider  acetaminophen (TYLENOL) 325 MG tablet Take 650 mg by mouth every 6 (six) hours as needed for mild pain, fever or headache.   Yes [provider]  ALPRAZolam Prudy Feeler) 0.5 MG tablet Take 0.5 mg by mouth 3 (three) times daily as needed for anxiety. 06/30/20  Yes [provider]  predniSONE (DELTASONE) 5 MG tablet Take 5 mg by mouth daily with breakfast. 04/13/20  Yes [provider]  temazepam (RESTORIL) 30 MG capsule Take 30 mg by mouth at bedtime. 06/08/20  Yes [provider]  venlafaxine XR (EFFEXOR-XR) 75 MG 24 hr capsule Take 75 mg by mouth daily. 04/13/20  Yes [provider]    Allergies    Naproxen, Other, Penicillins, and Prozac [fluoxetine]  Review of Systems   Review of Systems  Constitutional: Negative for appetite change and fatigue.  HENT: Negative for congestion, ear discharge and sinus pressure.   Eyes: Negative for discharge.  Respiratory: Negative for cough.    Cardiovascular: Negative for chest pain.  Gastrointestinal: Negative for abdominal pain and diarrhea.  Genitourinary: Negative for frequency and hematuria.  Musculoskeletal: Negative for back pain.       Right knee pain  Skin: Negative for rash.  Neurological: Negative for seizures and headaches.  Psychiatric/Behavioral: Negative for hallucinations.    Physical Exam Updated Vital Signs BP (!) 149/65   Pulse 83   Temp 98.2 F (36.8 C) (Oral)   Resp 20   SpO2 94%   Physical Exam Vitals and nursing note reviewed.  Constitutional:      Appearance: She is well-developed.  HENT:     Head: Normocephalic.     Nose: Nose normal.  Eyes:     General: No scleral icterus.    Conjunctiva/sclera: Conjunctivae normal.  Neck:     Thyroid: No thyromegaly.  Cardiovascular:     Rate and Rhythm: Normal rate and regular rhythm.     Heart sounds: No murmur heard. No friction rub. No gallop.   Pulmonary:     Breath sounds: No stridor. No wheezing or rales.  Chest:     Chest wall: No tenderness.  Abdominal:     General: There is no distension.     Tenderness: There is no abdominal tenderness. There is no rebound.  Musculoskeletal:        General: Normal range of motion.  Cervical back: Neck supple.     Comments: Patient with pain in the right knee no swelling  Lymphadenopathy:     Cervical: No cervical adenopathy.  Skin:    Findings: No erythema or rash.  Neurological:     Mental Status: She is alert and oriented to person, place, and time.     Motor: No abnormal muscle tone.     Coordination: Coordination normal.     Comments: Patient with a moderate tremor  Psychiatric:        Behavior: Behavior normal.     ED Results / Procedures / Treatments   Labs (all labs ordered are listed, but only abnormal results are displayed) Labs Reviewed  CBC WITH DIFFERENTIAL/PLATELET - Abnormal; Notable for the following components:      Result Value   WBC 13.8 (*)    Hemoglobin 11.6  (*)    RDW 18.9 (*)    Neutro Abs 9.8 (*)    Monocytes Absolute 1.6 (*)    All other components within normal limits  COMPREHENSIVE METABOLIC PANEL - Abnormal; Notable for the following components:   Glucose, Bld 126 (*)    All other components within normal limits  URINALYSIS, ROUTINE W REFLEX MICROSCOPIC    EKG None  Radiology DG Knee Complete 4 Views Right  Result Date: 07/02/2020 CLINICAL DATA:  Pain, twisted knee while walking EXAM: RIGHT KNEE - COMPLETE 4+ VIEW COMPARISON:  None. FINDINGS: Osteopenia. No fracture or dislocation of the right knee. Joint spaces are well preserved. Small, nonspecific knee joint effusion. Vascular calcinosis. IMPRESSION: 1. No fracture or dislocation of the right knee. 2. Osteopenia. 3. Small, nonspecific knee joint effusion. Electronically Signed   By: Lauralyn Primes M.D.   On: 07/02/2020 12:10    Procedures Procedures   Medications Ordered in ED Medications  oxyCODONE-acetaminophen (PERCOCET/ROXICET) 5-325 MG per tablet 1 tablet (has no administration in time range)  psyllium (HYDROCIL/METAMUCIL) 1 packet (has no administration in time range)  oxyCODONE-acetaminophen (PERCOCET/ROXICET) 5-325 MG per tablet 1 tablet (1 tablet Oral Given 07/02/20 1251)    ED Course  I have reviewed the triage vital signs and the nursing notes.  Pertinent labs & imaging results that were available during my care of the patient were reviewed by me and considered in my medical decision making (see chart for details).    MDM Rules/Calculators/A&P                          Patient with pain in right knee from knee strain and some arthritis.  She is given Percocet for pain.  We have also got in touch with home health that will and get in touch with her tomorrow to help arrange home health for assisting her with ambulation.  She uses a walker now.       Patient now decides that she will go to a nursing home because she cannot ambulate at home.  We will start looking for  placement Final Clinical Impression(s) / ED Diagnoses Final diagnoses:  None    Rx / DC Orders ED Discharge Orders    None       Bethann Berkshire, MD 07/02/20 1441    Bethann Berkshire, MD 07/02/20 1525    Bethann Berkshire, MD 07/04/20 1020

## 2020-07-02 NOTE — Care Management (Signed)
ED RNCM received call from [facility] CSW concerning arranging [facility] services.  Contacted several of our preferred [facility] companies and [facility] was able to accept referral.  RNCM contacted EDP Dr. Estell Harpin regarding needing HH orders placed.

## 2020-07-03 DIAGNOSIS — M25561 Pain in right knee: Secondary | ICD-10-CM | POA: Diagnosis not present

## 2020-07-03 LAB — RESP PANEL BY RT-PCR (FLU A&B, COVID) ARPGX2
Influenza A by PCR: NEGATIVE
Influenza B by PCR: NEGATIVE
SARS Coronavirus 2 by RT PCR: NEGATIVE

## 2020-07-03 NOTE — [place] FL2 (Signed)
[place] MEDICAID FL2 LEVEL OF CARE SCREENING TOOL     IDENTIFICATION  Patient Name: [name] Birthdate: 1929-09-20 Sex: female Admission Date (Current Location): 07/02/2020  [facility] and [place] Number:  Producer, television/film/video and Address:  [facility],  [address], [place] [address]      Provider Number: [id number]  Attending Physician Name and Address:  Default, Provider, MD  Relative Name and Phone Number:  Eldena Dede  [phone]    Current Level of Care: Hospital Recommended Level of Care: Skilled Nursing Facility Prior Approval Number:    Date Approved/Denied:   PASRR Number: [id number]  Discharge Plan: SNF    Current Diagnoses: There are no problems to display for this patient.   Orientation RESPIRATION BLADDER Height & Weight     Self,Time,Place  Normal Continent Weight:   Height:     BEHAVIORAL SYMPTOMS/MOOD NEUROLOGICAL BOWEL NUTRITION STATUS      Continent Diet (Regular)  AMBULATORY STATUS COMMUNICATION OF NEEDS Skin   Limited Assist Verbally Normal                       Personal Care Assistance Level of Assistance  Bathing,Feeding,Dressing Bathing Assistance: Maximum assistance Feeding assistance: Independent Dressing Assistance: Limited assistance     Functional Limitations Info  Sight,Hearing,Speech Sight Info: Adequate Hearing Info: Adequate Speech Info: Adequate    SPECIAL CARE FACTORS FREQUENCY  PT (By licensed PT),OT (By licensed OT)     PT Frequency: 5x a week. OT Frequency: 5x a week.            Contractures Contractures Info: Not present    Additional Factors Info  Code Status,Allergies Code Status Info: Full Code Allergies Info: Naproxen, Penicillins, and Prozac           Current Medications (07/03/2020):  This is the current hospital active medication list Current Facility-Administered Medications  Medication Dose Route Frequency Provider Last Rate Last Admin  . acetaminophen  (TYLENOL) tablet 650 mg  650 mg Oral Q6H PRN Derwood Kaplan, MD      . ALPRAZolam Prudy Feeler) tablet 0.5 mg  0.5 mg Oral TID PRN Derwood Kaplan, MD   0.5 mg at 07/03/20 0556  . predniSONE (DELTASONE) tablet 5 mg  5 mg Oral Q breakfast Rhunette Croft, Ankit, MD   5 mg at 07/03/20 0801  . psyllium (HYDROCIL/METAMUCIL) 1 packet  1 packet Oral Daily Bethann Berkshire, MD   1 packet at 07/03/20 1003  . temazepam (RESTORIL) capsule 30 mg  30 mg Oral QHS Derwood Kaplan, MD   30 mg at 07/02/20 2125  . venlafaxine XR (EFFEXOR-XR) 24 hr capsule 75 mg  75 mg Oral Daily Derwood Kaplan, MD   75 mg at 07/03/20 1004   Current Outpatient Medications  Medication Sig Dispense Refill  . acetaminophen (TYLENOL) 325 MG tablet Take 650 mg by mouth every 6 (six) hours as needed for mild pain, fever or headache.    . ALPRAZolam (XANAX) 0.5 MG tablet Take 0.5 mg by mouth 3 (three) times daily as needed for anxiety.    Marland Kitchen oxyCODONE-acetaminophen (PERCOCET) 5-325 MG tablet Take 1 tablet by mouth every 6 (six) hours as needed. 20 tablet 0  . predniSONE (DELTASONE) 5 MG tablet Take 5 mg by mouth daily with breakfast.    . temazepam (RESTORIL) 30 MG capsule Take 30 mg by mouth at bedtime.    Marland Kitchen venlafaxine XR (EFFEXOR-XR) 75 MG 24 hr capsule Take 75 mg by mouth daily.  Discharge Medications: Please see discharge summary for a list of discharge medications.  Relevant Imaging Results:  Relevant Lab Results:   Additional Information SSN#:  [id number]  Ruhama Lehew C Tarpley-Carter, LCSWA

## 2020-07-03 NOTE — Progress Notes (Signed)
TOC CSW sent out pts information to SNF for bed acceptance.  Family gave permission to seek SNF Placement.    CSW will continue to follow for dc needs.  Zae Kirtz Tarpley-Carter, MSW, LCSW-A Pronouns:  She, Her, Hers                  [name] Transitions of CareClinical Social Worker [email or web address] [phone]

## 2020-07-03 NOTE — Evaluation (Signed)
Physical Therapy Evaluation Patient Details Name: [name] MRN: [id number] DOB: 1930/03/08 Today's Date: 07/03/2020   History of Present Illness  87 yo female admitted with R knee pain, inabiilty to ambulate. Hx of RA, osteopenia, falls  Clinical Impression  On eval in ED, pt required Mod-Max assist for mobility. She was able to stand but unable to take any steps 2* pain. She was able to perform a lateral scoot towards the [facility] with Min assist. Significant pain in R knee with weightbearing and attempts at ambulation. Discussed d/c plan-pt is agreeable to rehab at [facility].     Follow Up Recommendations SNF    Equipment Recommendations  None recommended by PT    Recommendations for Other Services       Precautions / Restrictions Precautions Precautions: Fall Restrictions Weight Bearing Restrictions: No      Mobility  Bed Mobility Overal bed mobility: Needs Assistance Bed Mobility: Supine to Sit;Sit to Supine     Supine to sit: Mod assist;HOB elevated Sit to supine: Mod assist;HOB elevated   General bed mobility comments: Assist for trunk and bil LEs. Increased time. Cues for safety, technique.    Transfers Overall transfer level: Needs assistance Equipment used: Rolling walker (2 wheeled) Transfers: Sit to/from Stand;Lateral/Scoot Transfers Sit to Stand: Max assist;From elevated surface        Lateral/Scoot Transfers: Min assist General transfer comment: Assist to power up, stabilize, control descent. Pt only able to stand statically-unable to take any steps 2* pain. Lateral scoot towards HOB with Min A.  Ambulation/Gait             General Gait Details: NT-pt unable  Stairs            Wheelchair Mobility    Modified Rankin (Stroke Patients Only)       Balance Overall balance assessment: Needs assistance;History of Falls         Standing balance support: Bilateral upper extremity supported Standing balance-Leahy Scale: Poor                                Pertinent Vitals/Pain Pain Assessment: Faces Faces Pain Scale: Hurts whole lot Pain Location: R knee with weightbearing Pain Descriptors / Indicators: Sharp;Discomfort;Grimacing;Guarding;Sore Pain Intervention(s): Limited activity within patient's tolerance;Monitored during session;Repositioned    Home Living Family/patient expects to be discharged to:: Private residence Living Arrangements: Alone Available Help at Discharge: Family;Available PRN/intermittently Type of Home: House Home Access: Stairs to enter     Home Layout: One level Home Equipment: Environmental consultant - 2 wheels;Cane - single point      Prior Function Level of Independence: Independent         Comments: uses RW     Hand Dominance        Extremity/Trunk Assessment   Upper Extremity Assessment Upper Extremity Assessment: Generalized weakness    Lower Extremity Assessment Lower Extremity Assessment: Generalized weakness    Cervical / Trunk Assessment Cervical / Trunk Assessment: Normal  Communication   Communication: No difficulties  Cognition Arousal/Alertness: Awake/alert Behavior During Therapy: WFL for tasks assessed/performed Overall Cognitive Status: Within Functional Limits for tasks assessed                                        General Comments      Exercises     Assessment/Plan  PT Assessment Patient needs continued PT services  PT Problem List Decreased strength;Decreased mobility;Decreased balance;Decreased activity tolerance;Pain;Decreased range of motion;Decreased knowledge of use of DME       PT Treatment Interventions DME instruction;Therapeutic activities;Gait training;Functional mobility training;Balance training;Patient/family education;Therapeutic exercise    PT Goals (Current goals can be found in the Care Plan section)  Acute Rehab PT Goals Patient Stated Goal: less pain. regain independence PT Goal Formulation: With  patient Time For Goal Achievement: 07/17/20 Potential to Achieve Goals: Good    Frequency Min 2X/week   Barriers to discharge Decreased caregiver support      Co-evaluation               AM-PAC PT "6 Clicks" Mobility  Outcome Measure Help needed turning from your back to your side while in a flat bed without using bedrails?: A Lot Help needed moving from lying on your back to sitting on the side of a flat bed without using bedrails?: A Lot Help needed moving to and from a bed to a chair (including a wheelchair)?: A Lot Help needed standing up from a chair using your arms (e.g., wheelchair or bedside chair)?: A Lot Help needed to walk in hospital room?: Total Help needed climbing 3-5 steps with a railing? : Total 6 Click Score: 10    End of Session Equipment Utilized During Treatment: Gait belt Activity Tolerance: Patient limited by pain Patient left: in bed;with call bell/phone within reach;with bed alarm set   PT Visit Diagnosis: Muscle weakness (generalized) (M62.81);Difficulty in walking, not elsewhere classified (R26.2);Pain;History of falling (Z91.81) Pain - Right/Left: Right Pain - part of body: Knee    Time: 0175-1025 PT Time Calculation (min) (ACUTE ONLY): 18 min   Charges:   PT Evaluation $PT Eval Moderate Complexity: 1 Mod             Faye Ramsay, PT Acute Rehabilitation  Office: [phone] Pager: [phone]

## 2020-07-04 DIAGNOSIS — M79652 Pain in left thigh: Secondary | ICD-10-CM | POA: Diagnosis not present

## 2020-07-04 DIAGNOSIS — M25551 Pain in right hip: Secondary | ICD-10-CM | POA: Diagnosis not present

## 2020-07-04 DIAGNOSIS — Z743 Need for continuous supervision: Secondary | ICD-10-CM | POA: Diagnosis not present

## 2020-07-04 DIAGNOSIS — W19XXXD Unspecified fall, subsequent encounter: Secondary | ICD-10-CM | POA: Diagnosis not present

## 2020-07-04 DIAGNOSIS — M25561 Pain in right knee: Secondary | ICD-10-CM | POA: Diagnosis not present

## 2020-07-04 DIAGNOSIS — M79622 Pain in left upper arm: Secondary | ICD-10-CM | POA: Diagnosis not present

## 2020-07-04 DIAGNOSIS — F329 Major depressive disorder, single episode, unspecified: Secondary | ICD-10-CM | POA: Diagnosis not present

## 2020-07-04 DIAGNOSIS — M79651 Pain in right thigh: Secondary | ICD-10-CM | POA: Diagnosis not present

## 2020-07-04 DIAGNOSIS — F5101 Primary insomnia: Secondary | ICD-10-CM | POA: Diagnosis not present

## 2020-07-04 DIAGNOSIS — M069 Rheumatoid arthritis, unspecified: Secondary | ICD-10-CM | POA: Diagnosis not present

## 2020-07-04 DIAGNOSIS — M79661 Pain in right lower leg: Secondary | ICD-10-CM | POA: Diagnosis not present

## 2020-07-04 DIAGNOSIS — Z20822 Contact with and (suspected) exposure to covid-19: Secondary | ICD-10-CM | POA: Diagnosis not present

## 2020-07-04 DIAGNOSIS — R0902 Hypoxemia: Secondary | ICD-10-CM | POA: Diagnosis not present

## 2020-07-04 DIAGNOSIS — F064 Anxiety disorder due to known physiological condition: Secondary | ICD-10-CM | POA: Diagnosis not present

## 2020-07-04 DIAGNOSIS — F331 Major depressive disorder, recurrent, moderate: Secondary | ICD-10-CM | POA: Diagnosis not present

## 2020-07-04 DIAGNOSIS — R279 Unspecified lack of coordination: Secondary | ICD-10-CM | POA: Diagnosis not present

## 2020-07-04 DIAGNOSIS — Z23 Encounter for immunization: Secondary | ICD-10-CM | POA: Diagnosis not present

## 2020-07-04 DIAGNOSIS — R259 Unspecified abnormal involuntary movements: Secondary | ICD-10-CM | POA: Diagnosis not present

## 2020-07-04 DIAGNOSIS — R102 Pelvic and perineal pain: Secondary | ICD-10-CM | POA: Diagnosis not present

## 2020-07-04 DIAGNOSIS — W19XXXA Unspecified fall, initial encounter: Secondary | ICD-10-CM | POA: Diagnosis not present

## 2020-07-04 DIAGNOSIS — M8580 Other specified disorders of bone density and structure, unspecified site: Secondary | ICD-10-CM | POA: Diagnosis not present

## 2020-07-04 DIAGNOSIS — M25461 Effusion, right knee: Secondary | ICD-10-CM | POA: Diagnosis not present

## 2020-07-04 DIAGNOSIS — M79604 Pain in right leg: Secondary | ICD-10-CM | POA: Diagnosis not present

## 2020-07-04 DIAGNOSIS — M79662 Pain in left lower leg: Secondary | ICD-10-CM | POA: Diagnosis not present

## 2020-07-04 DIAGNOSIS — M79632 Pain in left forearm: Secondary | ICD-10-CM | POA: Diagnosis not present

## 2020-07-04 DIAGNOSIS — M6281 Muscle weakness (generalized): Secondary | ICD-10-CM | POA: Diagnosis not present

## 2020-07-04 DIAGNOSIS — F321 Major depressive disorder, single episode, moderate: Secondary | ICD-10-CM | POA: Diagnosis not present

## 2020-07-04 DIAGNOSIS — Z9181 History of falling: Secondary | ICD-10-CM | POA: Diagnosis not present

## 2020-07-04 MED ORDER — COVID-19 MRNA VACC (MODERNA) 100 MCG/0.5ML IM SUSP
0.5000 mL | Freq: Once | INTRAMUSCULAR | Status: AC
  Administered 2020-07-04: 0.5 mL via INTRAMUSCULAR
  Filled 2020-07-04: qty 0.5

## 2020-07-04 MED ORDER — NICOTINE 21 MG/24HR TD PT24
21.0000 mg | MEDICATED_PATCH | Freq: Once | TRANSDERMAL | Status: DC
  Administered 2020-07-04: 21 mg via TRANSDERMAL
  Filled 2020-07-04: qty 1

## 2020-07-04 NOTE — ED Provider Notes (Signed)
Asked by case management to obtain TB test to assist with placement   Melene Plan, DO 07/04/20 0900

## 2020-07-04 NOTE — ED Notes (Signed)
Assumed care of pt at this time. Pt resting in bed, sleeping, no s/sx of acute distress at this time.  

## 2020-07-04 NOTE — Progress Notes (Signed)
TOC CSW updated pts family/Ivey Shankle [phone] in regards to pts vaccine (Moderna), and pt will be transitioning to [facility].  Godson Pollan Tarpley-Carter, MSW, LCSW-A Pronouns:  She, Her, Hers                  [name] Transitions of CareClinical Social Worker [email or web address] [phone]

## 2020-07-04 NOTE — Progress Notes (Signed)
TOC CSW spoke with EDP about Covid vaccinating pt.  EDP agreed to looking into this.  Jaysie Benthall Tarpley-Carter, MSW, LCSW-A Pronouns:  She, Her, Hers                  [name] Transitions of CareClinical Social Worker [email or web address] [phone]

## 2020-07-04 NOTE — ED Notes (Signed)
PTAR called for transport.  

## 2020-07-04 NOTE — ED Notes (Signed)
Family updated as to patient's status.

## 2020-07-04 NOTE — Progress Notes (Signed)
TOC CSW spoke with [facility].  [facility] are currently reviewing pt for acceptance.  Adela Esteban Tarpley-Carter, MSW, LCSW-A Pronouns:  She, Her, Hers                  [name] Transitions of CareClinical Social Worker [email or web address] [phone]

## 2020-07-04 NOTE — ED Notes (Signed)
PTAR here for transport. 

## 2020-07-04 NOTE — Progress Notes (Signed)
TOC CSW spoke with [facility].  Pts husband is currently at [facility] and would prefer to go there.  Lowella Bandy is reviewing pt currently.  Raihana Balderrama Tarpley-Carter, MSW, LCSW-A Pronouns:  She, Her, Hers                  [name] Transitions of CareClinical Social Worker [email or web address] [phone]

## 2020-07-04 NOTE — Progress Notes (Signed)
TOC CSW received contact from [facility].  [name] will not be able to accept pt due to her only receive one vaccine, therefore she is not fully vaccinated.  [name], MSW, LCSW-A Pronouns:  She, Her, Hers                  [name] Transitions of CareClinical Social Worker [email or web address] [phone]

## 2020-07-04 NOTE — ED Notes (Signed)
Pt eating breakfast 

## 2020-07-04 NOTE — Progress Notes (Signed)
TOC CSW spoke with [name] granddaughter [phone].  CSW informed [name] that pt had been denied to [facility] due to her not being fully vaccinated.  CSW also disclosed that [facility] was reviewing pt.  [name] was in agreeance with pt going to [facility] once accepted.   [name], MSW, LCSW-A Pronouns:  She, Her, Hers                  [name] ED Transitions of CareClinical Social Worker [name] [phone]

## 2020-07-04 NOTE — Progress Notes (Addendum)
TOC CSW spoke with [facility].  Pt has been approved and information below will be shared with RN and EDP to began discharge.  Room # at [facility]:  125  Call Report #:  [phone]  Antawan Mchugh Tarpley-Carter, MSW, LCSW-A Pronouns:  She, Her, Hers                  [name] Transitions of CareClinical Social Worker [email or web address] [phone]

## 2020-07-06 DIAGNOSIS — M069 Rheumatoid arthritis, unspecified: Secondary | ICD-10-CM | POA: Diagnosis not present

## 2020-07-06 DIAGNOSIS — Z9181 History of falling: Secondary | ICD-10-CM | POA: Diagnosis not present

## 2020-07-06 DIAGNOSIS — M25561 Pain in right knee: Secondary | ICD-10-CM | POA: Diagnosis not present

## 2020-07-06 DIAGNOSIS — M8580 Other specified disorders of bone density and structure, unspecified site: Secondary | ICD-10-CM | POA: Diagnosis not present

## 2020-07-06 LAB — QUANTIFERON-TB GOLD PLUS: QuantiFERON-TB Gold Plus: NEGATIVE

## 2020-07-06 LAB — QUANTIFERON-TB GOLD PLUS (RQFGPL)
QuantiFERON Mitogen Value: >10 IU/mL
QuantiFERON Nil Value: 0.03 IU/mL
QuantiFERON TB1 Ag Value: 0.05 IU/mL
QuantiFERON TB2 Ag Value: 0.04 IU/mL

## 2020-07-14 ENCOUNTER — Other Ambulatory Visit: Payer: Self-pay | Admitting: *Deleted

## 2020-07-14 NOTE — Patient Outreach (Signed)
Member screened for potential [facility] Care Management needs.  Mrs. Zirbel is receiving skilled therapy at [facility]. Update received from SNF SW indicating member will transition to [facility]. Has very supportive grandchildren. Unfortunately her spouse is in a different SNF and is not expected to live.   No identifiable [facility] Care Management needs at this time.    Raiford Noble, MSN, RN,BSN [facility] Post Acute Care Coordinator [phone] [facility]) [phone]  (Toll free office)

## 2020-07-20 DIAGNOSIS — M25551 Pain in right hip: Secondary | ICD-10-CM | POA: Diagnosis not present

## 2020-07-20 DIAGNOSIS — M6281 Muscle weakness (generalized): Secondary | ICD-10-CM | POA: Diagnosis not present

## 2020-07-20 DIAGNOSIS — M79604 Pain in right leg: Secondary | ICD-10-CM | POA: Diagnosis not present

## 2020-07-20 DIAGNOSIS — W19XXXA Unspecified fall, initial encounter: Secondary | ICD-10-CM | POA: Diagnosis not present

## 2020-07-20 DIAGNOSIS — M25561 Pain in right knee: Secondary | ICD-10-CM | POA: Diagnosis not present

## 2020-07-21 DIAGNOSIS — F331 Major depressive disorder, recurrent, moderate: Secondary | ICD-10-CM | POA: Diagnosis not present

## 2020-07-21 DIAGNOSIS — F5101 Primary insomnia: Secondary | ICD-10-CM | POA: Diagnosis not present

## 2020-07-21 DIAGNOSIS — F064 Anxiety disorder due to known physiological condition: Secondary | ICD-10-CM | POA: Diagnosis not present

## 2020-07-27 DIAGNOSIS — M25561 Pain in right knee: Secondary | ICD-10-CM | POA: Diagnosis not present

## 2020-07-27 DIAGNOSIS — M6281 Muscle weakness (generalized): Secondary | ICD-10-CM | POA: Diagnosis not present

## 2020-07-27 DIAGNOSIS — W19XXXD Unspecified fall, subsequent encounter: Secondary | ICD-10-CM | POA: Diagnosis not present

## 2020-07-27 DIAGNOSIS — M069 Rheumatoid arthritis, unspecified: Secondary | ICD-10-CM | POA: Diagnosis not present

## 2020-07-27 DIAGNOSIS — F321 Major depressive disorder, single episode, moderate: Secondary | ICD-10-CM | POA: Diagnosis not present

## 2020-07-30 DIAGNOSIS — M25461 Effusion, right knee: Secondary | ICD-10-CM | POA: Diagnosis not present

## 2020-07-30 DIAGNOSIS — Z7952 Long term (current) use of systemic steroids: Secondary | ICD-10-CM | POA: Diagnosis not present

## 2020-07-30 DIAGNOSIS — Z87891 Personal history of nicotine dependence: Secondary | ICD-10-CM | POA: Diagnosis not present

## 2020-07-30 DIAGNOSIS — I10 Essential (primary) hypertension: Secondary | ICD-10-CM | POA: Diagnosis not present

## 2020-07-30 DIAGNOSIS — M069 Rheumatoid arthritis, unspecified: Secondary | ICD-10-CM | POA: Diagnosis not present

## 2020-07-30 DIAGNOSIS — F419 Anxiety disorder, unspecified: Secondary | ICD-10-CM | POA: Diagnosis not present

## 2020-07-30 DIAGNOSIS — F331 Major depressive disorder, recurrent, moderate: Secondary | ICD-10-CM | POA: Diagnosis not present

## 2020-07-31 DIAGNOSIS — Z87891 Personal history of nicotine dependence: Secondary | ICD-10-CM | POA: Diagnosis not present

## 2020-07-31 DIAGNOSIS — M069 Rheumatoid arthritis, unspecified: Secondary | ICD-10-CM | POA: Diagnosis not present

## 2020-07-31 DIAGNOSIS — F419 Anxiety disorder, unspecified: Secondary | ICD-10-CM | POA: Diagnosis not present

## 2020-07-31 DIAGNOSIS — F331 Major depressive disorder, recurrent, moderate: Secondary | ICD-10-CM | POA: Diagnosis not present

## 2020-07-31 DIAGNOSIS — I10 Essential (primary) hypertension: Secondary | ICD-10-CM | POA: Diagnosis not present

## 2020-07-31 DIAGNOSIS — M25461 Effusion, right knee: Secondary | ICD-10-CM | POA: Diagnosis not present

## 2020-08-04 DIAGNOSIS — I10 Essential (primary) hypertension: Secondary | ICD-10-CM | POA: Diagnosis not present

## 2020-08-04 DIAGNOSIS — F419 Anxiety disorder, unspecified: Secondary | ICD-10-CM | POA: Diagnosis not present

## 2020-08-04 DIAGNOSIS — M069 Rheumatoid arthritis, unspecified: Secondary | ICD-10-CM | POA: Diagnosis not present

## 2020-08-04 DIAGNOSIS — M25461 Effusion, right knee: Secondary | ICD-10-CM | POA: Diagnosis not present

## 2020-08-04 DIAGNOSIS — F331 Major depressive disorder, recurrent, moderate: Secondary | ICD-10-CM | POA: Diagnosis not present

## 2020-08-04 DIAGNOSIS — Z87891 Personal history of nicotine dependence: Secondary | ICD-10-CM | POA: Diagnosis not present

## 2020-08-07 DIAGNOSIS — F5101 Primary insomnia: Secondary | ICD-10-CM | POA: Diagnosis not present

## 2020-08-07 DIAGNOSIS — K5901 Slow transit constipation: Secondary | ICD-10-CM | POA: Diagnosis not present

## 2020-08-07 DIAGNOSIS — F331 Major depressive disorder, recurrent, moderate: Secondary | ICD-10-CM | POA: Diagnosis not present

## 2020-08-07 DIAGNOSIS — I10 Essential (primary) hypertension: Secondary | ICD-10-CM | POA: Diagnosis not present

## 2020-08-08 DIAGNOSIS — M25461 Effusion, right knee: Secondary | ICD-10-CM | POA: Diagnosis not present

## 2020-08-08 DIAGNOSIS — M069 Rheumatoid arthritis, unspecified: Secondary | ICD-10-CM | POA: Diagnosis not present

## 2020-08-08 DIAGNOSIS — F331 Major depressive disorder, recurrent, moderate: Secondary | ICD-10-CM | POA: Diagnosis not present

## 2020-08-08 DIAGNOSIS — I10 Essential (primary) hypertension: Secondary | ICD-10-CM | POA: Diagnosis not present

## 2020-08-08 DIAGNOSIS — F419 Anxiety disorder, unspecified: Secondary | ICD-10-CM | POA: Diagnosis not present

## 2020-08-08 DIAGNOSIS — Z87891 Personal history of nicotine dependence: Secondary | ICD-10-CM | POA: Diagnosis not present

## 2020-08-09 DIAGNOSIS — F419 Anxiety disorder, unspecified: Secondary | ICD-10-CM | POA: Diagnosis not present

## 2020-08-09 DIAGNOSIS — I10 Essential (primary) hypertension: Secondary | ICD-10-CM | POA: Diagnosis not present

## 2020-08-09 DIAGNOSIS — M069 Rheumatoid arthritis, unspecified: Secondary | ICD-10-CM | POA: Diagnosis not present

## 2020-08-09 DIAGNOSIS — Z87891 Personal history of nicotine dependence: Secondary | ICD-10-CM | POA: Diagnosis not present

## 2020-08-09 DIAGNOSIS — F331 Major depressive disorder, recurrent, moderate: Secondary | ICD-10-CM | POA: Diagnosis not present

## 2020-08-09 DIAGNOSIS — M25461 Effusion, right knee: Secondary | ICD-10-CM | POA: Diagnosis not present

## 2020-08-10 DIAGNOSIS — E119 Type 2 diabetes mellitus without complications: Secondary | ICD-10-CM | POA: Diagnosis not present

## 2020-08-10 DIAGNOSIS — E782 Mixed hyperlipidemia: Secondary | ICD-10-CM | POA: Diagnosis not present

## 2020-08-10 DIAGNOSIS — I10 Essential (primary) hypertension: Secondary | ICD-10-CM | POA: Diagnosis not present

## 2020-08-10 DIAGNOSIS — E039 Hypothyroidism, unspecified: Secondary | ICD-10-CM | POA: Diagnosis not present

## 2020-08-12 DIAGNOSIS — I10 Essential (primary) hypertension: Secondary | ICD-10-CM | POA: Diagnosis not present

## 2020-08-12 DIAGNOSIS — F419 Anxiety disorder, unspecified: Secondary | ICD-10-CM | POA: Diagnosis not present

## 2020-08-12 DIAGNOSIS — M25461 Effusion, right knee: Secondary | ICD-10-CM | POA: Diagnosis not present

## 2020-08-12 DIAGNOSIS — M069 Rheumatoid arthritis, unspecified: Secondary | ICD-10-CM | POA: Diagnosis not present

## 2020-08-12 DIAGNOSIS — Z87891 Personal history of nicotine dependence: Secondary | ICD-10-CM | POA: Diagnosis not present

## 2020-08-12 DIAGNOSIS — F331 Major depressive disorder, recurrent, moderate: Secondary | ICD-10-CM | POA: Diagnosis not present

## 2020-08-15 DIAGNOSIS — M25461 Effusion, right knee: Secondary | ICD-10-CM | POA: Diagnosis not present

## 2020-08-15 DIAGNOSIS — F419 Anxiety disorder, unspecified: Secondary | ICD-10-CM | POA: Diagnosis not present

## 2020-08-15 DIAGNOSIS — F331 Major depressive disorder, recurrent, moderate: Secondary | ICD-10-CM | POA: Diagnosis not present

## 2020-08-15 DIAGNOSIS — Z87891 Personal history of nicotine dependence: Secondary | ICD-10-CM | POA: Diagnosis not present

## 2020-08-15 DIAGNOSIS — I10 Essential (primary) hypertension: Secondary | ICD-10-CM | POA: Diagnosis not present

## 2020-08-15 DIAGNOSIS — M069 Rheumatoid arthritis, unspecified: Secondary | ICD-10-CM | POA: Diagnosis not present

## 2020-08-16 DIAGNOSIS — Z23 Encounter for immunization: Secondary | ICD-10-CM | POA: Diagnosis not present

## 2020-08-17 DIAGNOSIS — I10 Essential (primary) hypertension: Secondary | ICD-10-CM | POA: Diagnosis not present

## 2020-08-17 DIAGNOSIS — Z87891 Personal history of nicotine dependence: Secondary | ICD-10-CM | POA: Diagnosis not present

## 2020-08-17 DIAGNOSIS — F419 Anxiety disorder, unspecified: Secondary | ICD-10-CM | POA: Diagnosis not present

## 2020-08-17 DIAGNOSIS — M069 Rheumatoid arthritis, unspecified: Secondary | ICD-10-CM | POA: Diagnosis not present

## 2020-08-17 DIAGNOSIS — M25461 Effusion, right knee: Secondary | ICD-10-CM | POA: Diagnosis not present

## 2020-08-17 DIAGNOSIS — F331 Major depressive disorder, recurrent, moderate: Secondary | ICD-10-CM | POA: Diagnosis not present

## 2020-08-18 DIAGNOSIS — Z87891 Personal history of nicotine dependence: Secondary | ICD-10-CM | POA: Diagnosis not present

## 2020-08-18 DIAGNOSIS — F331 Major depressive disorder, recurrent, moderate: Secondary | ICD-10-CM | POA: Diagnosis not present

## 2020-08-18 DIAGNOSIS — M25461 Effusion, right knee: Secondary | ICD-10-CM | POA: Diagnosis not present

## 2020-08-18 DIAGNOSIS — I10 Essential (primary) hypertension: Secondary | ICD-10-CM | POA: Diagnosis not present

## 2020-08-18 DIAGNOSIS — M069 Rheumatoid arthritis, unspecified: Secondary | ICD-10-CM | POA: Diagnosis not present

## 2020-08-18 DIAGNOSIS — F419 Anxiety disorder, unspecified: Secondary | ICD-10-CM | POA: Diagnosis not present

## 2020-08-21 DIAGNOSIS — M25461 Effusion, right knee: Secondary | ICD-10-CM | POA: Diagnosis not present

## 2020-08-21 DIAGNOSIS — M069 Rheumatoid arthritis, unspecified: Secondary | ICD-10-CM | POA: Diagnosis not present

## 2020-08-21 DIAGNOSIS — G894 Chronic pain syndrome: Secondary | ICD-10-CM | POA: Diagnosis not present

## 2020-08-21 DIAGNOSIS — F419 Anxiety disorder, unspecified: Secondary | ICD-10-CM | POA: Diagnosis not present

## 2020-08-21 DIAGNOSIS — M059 Rheumatoid arthritis with rheumatoid factor, unspecified: Secondary | ICD-10-CM | POA: Diagnosis not present

## 2020-08-21 DIAGNOSIS — Z87891 Personal history of nicotine dependence: Secondary | ICD-10-CM | POA: Diagnosis not present

## 2020-08-21 DIAGNOSIS — I10 Essential (primary) hypertension: Secondary | ICD-10-CM | POA: Diagnosis not present

## 2020-08-21 DIAGNOSIS — F331 Major depressive disorder, recurrent, moderate: Secondary | ICD-10-CM | POA: Diagnosis not present

## 2020-08-23 DIAGNOSIS — M25461 Effusion, right knee: Secondary | ICD-10-CM | POA: Diagnosis not present

## 2020-08-23 DIAGNOSIS — F419 Anxiety disorder, unspecified: Secondary | ICD-10-CM | POA: Diagnosis not present

## 2020-08-23 DIAGNOSIS — F331 Major depressive disorder, recurrent, moderate: Secondary | ICD-10-CM | POA: Diagnosis not present

## 2020-08-23 DIAGNOSIS — M069 Rheumatoid arthritis, unspecified: Secondary | ICD-10-CM | POA: Diagnosis not present

## 2020-08-23 DIAGNOSIS — Z87891 Personal history of nicotine dependence: Secondary | ICD-10-CM | POA: Diagnosis not present

## 2020-08-23 DIAGNOSIS — I10 Essential (primary) hypertension: Secondary | ICD-10-CM | POA: Diagnosis not present

## 2020-08-28 DIAGNOSIS — Z87891 Personal history of nicotine dependence: Secondary | ICD-10-CM | POA: Diagnosis not present

## 2020-08-28 DIAGNOSIS — M25461 Effusion, right knee: Secondary | ICD-10-CM | POA: Diagnosis not present

## 2020-08-28 DIAGNOSIS — Z03818 Encounter for observation for suspected exposure to other biological agents ruled out: Secondary | ICD-10-CM | POA: Diagnosis not present

## 2020-08-28 DIAGNOSIS — I10 Essential (primary) hypertension: Secondary | ICD-10-CM | POA: Diagnosis not present

## 2020-08-28 DIAGNOSIS — F331 Major depressive disorder, recurrent, moderate: Secondary | ICD-10-CM | POA: Diagnosis not present

## 2020-08-28 DIAGNOSIS — M069 Rheumatoid arthritis, unspecified: Secondary | ICD-10-CM | POA: Diagnosis not present

## 2020-08-28 DIAGNOSIS — F419 Anxiety disorder, unspecified: Secondary | ICD-10-CM | POA: Diagnosis not present

## 2020-08-29 DIAGNOSIS — M25461 Effusion, right knee: Secondary | ICD-10-CM | POA: Diagnosis not present

## 2020-08-29 DIAGNOSIS — Z87891 Personal history of nicotine dependence: Secondary | ICD-10-CM | POA: Diagnosis not present

## 2020-08-29 DIAGNOSIS — Z7952 Long term (current) use of systemic steroids: Secondary | ICD-10-CM | POA: Diagnosis not present

## 2020-08-29 DIAGNOSIS — M069 Rheumatoid arthritis, unspecified: Secondary | ICD-10-CM | POA: Diagnosis not present

## 2020-08-29 DIAGNOSIS — F419 Anxiety disorder, unspecified: Secondary | ICD-10-CM | POA: Diagnosis not present

## 2020-08-29 DIAGNOSIS — I10 Essential (primary) hypertension: Secondary | ICD-10-CM | POA: Diagnosis not present

## 2020-08-29 DIAGNOSIS — F5101 Primary insomnia: Secondary | ICD-10-CM | POA: Diagnosis not present

## 2020-08-29 DIAGNOSIS — K5901 Slow transit constipation: Secondary | ICD-10-CM | POA: Diagnosis not present

## 2020-08-29 DIAGNOSIS — F331 Major depressive disorder, recurrent, moderate: Secondary | ICD-10-CM | POA: Diagnosis not present

## 2020-08-30 DIAGNOSIS — F419 Anxiety disorder, unspecified: Secondary | ICD-10-CM | POA: Diagnosis not present

## 2020-08-30 DIAGNOSIS — Z87891 Personal history of nicotine dependence: Secondary | ICD-10-CM | POA: Diagnosis not present

## 2020-08-30 DIAGNOSIS — M25461 Effusion, right knee: Secondary | ICD-10-CM | POA: Diagnosis not present

## 2020-08-30 DIAGNOSIS — I10 Essential (primary) hypertension: Secondary | ICD-10-CM | POA: Diagnosis not present

## 2020-08-30 DIAGNOSIS — M069 Rheumatoid arthritis, unspecified: Secondary | ICD-10-CM | POA: Diagnosis not present

## 2020-08-30 DIAGNOSIS — F331 Major depressive disorder, recurrent, moderate: Secondary | ICD-10-CM | POA: Diagnosis not present

## 2020-08-31 DIAGNOSIS — Z87891 Personal history of nicotine dependence: Secondary | ICD-10-CM | POA: Diagnosis not present

## 2020-08-31 DIAGNOSIS — M25461 Effusion, right knee: Secondary | ICD-10-CM | POA: Diagnosis not present

## 2020-08-31 DIAGNOSIS — F331 Major depressive disorder, recurrent, moderate: Secondary | ICD-10-CM | POA: Diagnosis not present

## 2020-08-31 DIAGNOSIS — I10 Essential (primary) hypertension: Secondary | ICD-10-CM | POA: Diagnosis not present

## 2020-08-31 DIAGNOSIS — F419 Anxiety disorder, unspecified: Secondary | ICD-10-CM | POA: Diagnosis not present

## 2020-08-31 DIAGNOSIS — M069 Rheumatoid arthritis, unspecified: Secondary | ICD-10-CM | POA: Diagnosis not present

## 2020-09-05 DIAGNOSIS — F419 Anxiety disorder, unspecified: Secondary | ICD-10-CM | POA: Diagnosis not present

## 2020-09-05 DIAGNOSIS — M25461 Effusion, right knee: Secondary | ICD-10-CM | POA: Diagnosis not present

## 2020-09-05 DIAGNOSIS — M069 Rheumatoid arthritis, unspecified: Secondary | ICD-10-CM | POA: Diagnosis not present

## 2020-09-05 DIAGNOSIS — Z87891 Personal history of nicotine dependence: Secondary | ICD-10-CM | POA: Diagnosis not present

## 2020-09-05 DIAGNOSIS — F331 Major depressive disorder, recurrent, moderate: Secondary | ICD-10-CM | POA: Diagnosis not present

## 2020-09-05 DIAGNOSIS — Z03818 Encounter for observation for suspected exposure to other biological agents ruled out: Secondary | ICD-10-CM | POA: Diagnosis not present

## 2020-09-05 DIAGNOSIS — I10 Essential (primary) hypertension: Secondary | ICD-10-CM | POA: Diagnosis not present

## 2020-09-06 DIAGNOSIS — F419 Anxiety disorder, unspecified: Secondary | ICD-10-CM | POA: Diagnosis not present

## 2020-09-06 DIAGNOSIS — M25461 Effusion, right knee: Secondary | ICD-10-CM | POA: Diagnosis not present

## 2020-09-06 DIAGNOSIS — M069 Rheumatoid arthritis, unspecified: Secondary | ICD-10-CM | POA: Diagnosis not present

## 2020-09-06 DIAGNOSIS — Z87891 Personal history of nicotine dependence: Secondary | ICD-10-CM | POA: Diagnosis not present

## 2020-09-06 DIAGNOSIS — I10 Essential (primary) hypertension: Secondary | ICD-10-CM | POA: Diagnosis not present

## 2020-09-06 DIAGNOSIS — F331 Major depressive disorder, recurrent, moderate: Secondary | ICD-10-CM | POA: Diagnosis not present

## 2020-09-07 DIAGNOSIS — I10 Essential (primary) hypertension: Secondary | ICD-10-CM | POA: Diagnosis not present

## 2020-09-07 DIAGNOSIS — Z87891 Personal history of nicotine dependence: Secondary | ICD-10-CM | POA: Diagnosis not present

## 2020-09-07 DIAGNOSIS — F331 Major depressive disorder, recurrent, moderate: Secondary | ICD-10-CM | POA: Diagnosis not present

## 2020-09-07 DIAGNOSIS — F419 Anxiety disorder, unspecified: Secondary | ICD-10-CM | POA: Diagnosis not present

## 2020-09-07 DIAGNOSIS — M069 Rheumatoid arthritis, unspecified: Secondary | ICD-10-CM | POA: Diagnosis not present

## 2020-09-07 DIAGNOSIS — M25461 Effusion, right knee: Secondary | ICD-10-CM | POA: Diagnosis not present

## 2020-09-11 DIAGNOSIS — F419 Anxiety disorder, unspecified: Secondary | ICD-10-CM | POA: Diagnosis not present

## 2020-09-11 DIAGNOSIS — M069 Rheumatoid arthritis, unspecified: Secondary | ICD-10-CM | POA: Diagnosis not present

## 2020-09-11 DIAGNOSIS — Z03818 Encounter for observation for suspected exposure to other biological agents ruled out: Secondary | ICD-10-CM | POA: Diagnosis not present

## 2020-09-11 DIAGNOSIS — F331 Major depressive disorder, recurrent, moderate: Secondary | ICD-10-CM | POA: Diagnosis not present

## 2020-09-11 DIAGNOSIS — I10 Essential (primary) hypertension: Secondary | ICD-10-CM | POA: Diagnosis not present

## 2020-09-11 DIAGNOSIS — Z87891 Personal history of nicotine dependence: Secondary | ICD-10-CM | POA: Diagnosis not present

## 2020-09-11 DIAGNOSIS — M25461 Effusion, right knee: Secondary | ICD-10-CM | POA: Diagnosis not present

## 2020-09-13 DIAGNOSIS — F419 Anxiety disorder, unspecified: Secondary | ICD-10-CM | POA: Diagnosis not present

## 2020-09-13 DIAGNOSIS — M069 Rheumatoid arthritis, unspecified: Secondary | ICD-10-CM | POA: Diagnosis not present

## 2020-09-13 DIAGNOSIS — I10 Essential (primary) hypertension: Secondary | ICD-10-CM | POA: Diagnosis not present

## 2020-09-13 DIAGNOSIS — F331 Major depressive disorder, recurrent, moderate: Secondary | ICD-10-CM | POA: Diagnosis not present

## 2020-09-13 DIAGNOSIS — Z87891 Personal history of nicotine dependence: Secondary | ICD-10-CM | POA: Diagnosis not present

## 2020-09-13 DIAGNOSIS — M25461 Effusion, right knee: Secondary | ICD-10-CM | POA: Diagnosis not present

## 2020-09-14 DIAGNOSIS — F331 Major depressive disorder, recurrent, moderate: Secondary | ICD-10-CM | POA: Diagnosis not present

## 2020-09-14 DIAGNOSIS — M25461 Effusion, right knee: Secondary | ICD-10-CM | POA: Diagnosis not present

## 2020-09-14 DIAGNOSIS — F419 Anxiety disorder, unspecified: Secondary | ICD-10-CM | POA: Diagnosis not present

## 2020-09-14 DIAGNOSIS — M069 Rheumatoid arthritis, unspecified: Secondary | ICD-10-CM | POA: Diagnosis not present

## 2020-09-14 DIAGNOSIS — Z87891 Personal history of nicotine dependence: Secondary | ICD-10-CM | POA: Diagnosis not present

## 2020-09-14 DIAGNOSIS — I10 Essential (primary) hypertension: Secondary | ICD-10-CM | POA: Diagnosis not present

## 2020-09-18 DIAGNOSIS — M069 Rheumatoid arthritis, unspecified: Secondary | ICD-10-CM | POA: Diagnosis not present

## 2020-09-18 DIAGNOSIS — G894 Chronic pain syndrome: Secondary | ICD-10-CM | POA: Diagnosis not present

## 2020-09-20 DIAGNOSIS — M069 Rheumatoid arthritis, unspecified: Secondary | ICD-10-CM | POA: Diagnosis not present

## 2020-09-20 DIAGNOSIS — F419 Anxiety disorder, unspecified: Secondary | ICD-10-CM | POA: Diagnosis not present

## 2020-09-20 DIAGNOSIS — M25461 Effusion, right knee: Secondary | ICD-10-CM | POA: Diagnosis not present

## 2020-09-20 DIAGNOSIS — Z87891 Personal history of nicotine dependence: Secondary | ICD-10-CM | POA: Diagnosis not present

## 2020-09-20 DIAGNOSIS — I10 Essential (primary) hypertension: Secondary | ICD-10-CM | POA: Diagnosis not present

## 2020-09-20 DIAGNOSIS — F331 Major depressive disorder, recurrent, moderate: Secondary | ICD-10-CM | POA: Diagnosis not present

## 2020-09-22 DIAGNOSIS — M069 Rheumatoid arthritis, unspecified: Secondary | ICD-10-CM | POA: Diagnosis not present

## 2020-09-22 DIAGNOSIS — F331 Major depressive disorder, recurrent, moderate: Secondary | ICD-10-CM | POA: Diagnosis not present

## 2020-09-22 DIAGNOSIS — F419 Anxiety disorder, unspecified: Secondary | ICD-10-CM | POA: Diagnosis not present

## 2020-09-22 DIAGNOSIS — I10 Essential (primary) hypertension: Secondary | ICD-10-CM | POA: Diagnosis not present

## 2020-09-22 DIAGNOSIS — Z87891 Personal history of nicotine dependence: Secondary | ICD-10-CM | POA: Diagnosis not present

## 2020-09-22 DIAGNOSIS — M25461 Effusion, right knee: Secondary | ICD-10-CM | POA: Diagnosis not present

## 2020-09-25 DIAGNOSIS — M069 Rheumatoid arthritis, unspecified: Secondary | ICD-10-CM | POA: Diagnosis not present

## 2020-09-25 DIAGNOSIS — M25461 Effusion, right knee: Secondary | ICD-10-CM | POA: Diagnosis not present

## 2020-09-25 DIAGNOSIS — I10 Essential (primary) hypertension: Secondary | ICD-10-CM | POA: Diagnosis not present

## 2020-09-25 DIAGNOSIS — M6281 Muscle weakness (generalized): Secondary | ICD-10-CM | POA: Diagnosis not present

## 2020-09-25 DIAGNOSIS — F419 Anxiety disorder, unspecified: Secondary | ICD-10-CM | POA: Diagnosis not present

## 2020-09-25 DIAGNOSIS — Z87891 Personal history of nicotine dependence: Secondary | ICD-10-CM | POA: Diagnosis not present

## 2020-09-25 DIAGNOSIS — K5901 Slow transit constipation: Secondary | ICD-10-CM | POA: Diagnosis not present

## 2020-09-25 DIAGNOSIS — F331 Major depressive disorder, recurrent, moderate: Secondary | ICD-10-CM | POA: Diagnosis not present

## 2020-09-25 DIAGNOSIS — G894 Chronic pain syndrome: Secondary | ICD-10-CM | POA: Diagnosis not present

## 2020-09-25 DIAGNOSIS — F064 Anxiety disorder due to known physiological condition: Secondary | ICD-10-CM | POA: Diagnosis not present

## 2020-09-26 DIAGNOSIS — R0981 Nasal congestion: Secondary | ICD-10-CM | POA: Diagnosis not present

## 2020-09-26 DIAGNOSIS — R058 Other specified cough: Secondary | ICD-10-CM | POA: Diagnosis not present

## 2020-09-26 DIAGNOSIS — R9431 Abnormal electrocardiogram [ECG] [EKG]: Secondary | ICD-10-CM | POA: Diagnosis not present

## 2020-09-27 DIAGNOSIS — Z87891 Personal history of nicotine dependence: Secondary | ICD-10-CM | POA: Diagnosis not present

## 2020-09-27 DIAGNOSIS — I4891 Unspecified atrial fibrillation: Secondary | ICD-10-CM | POA: Diagnosis not present

## 2020-09-27 DIAGNOSIS — M25461 Effusion, right knee: Secondary | ICD-10-CM | POA: Diagnosis not present

## 2020-09-27 DIAGNOSIS — I10 Essential (primary) hypertension: Secondary | ICD-10-CM | POA: Diagnosis not present

## 2020-09-27 DIAGNOSIS — M069 Rheumatoid arthritis, unspecified: Secondary | ICD-10-CM | POA: Diagnosis not present

## 2020-09-27 DIAGNOSIS — F419 Anxiety disorder, unspecified: Secondary | ICD-10-CM | POA: Diagnosis not present

## 2020-09-27 DIAGNOSIS — F331 Major depressive disorder, recurrent, moderate: Secondary | ICD-10-CM | POA: Diagnosis not present

## 2020-09-28 DIAGNOSIS — Z87891 Personal history of nicotine dependence: Secondary | ICD-10-CM | POA: Diagnosis not present

## 2020-09-28 DIAGNOSIS — M069 Rheumatoid arthritis, unspecified: Secondary | ICD-10-CM | POA: Diagnosis not present

## 2020-09-28 DIAGNOSIS — I10 Essential (primary) hypertension: Secondary | ICD-10-CM | POA: Diagnosis not present

## 2020-09-28 DIAGNOSIS — M25461 Effusion, right knee: Secondary | ICD-10-CM | POA: Diagnosis not present

## 2020-09-28 DIAGNOSIS — F419 Anxiety disorder, unspecified: Secondary | ICD-10-CM | POA: Diagnosis not present

## 2020-09-28 DIAGNOSIS — F331 Major depressive disorder, recurrent, moderate: Secondary | ICD-10-CM | POA: Diagnosis not present

## 2020-09-28 DIAGNOSIS — Z7952 Long term (current) use of systemic steroids: Secondary | ICD-10-CM | POA: Diagnosis not present

## 2020-10-02 DIAGNOSIS — M25461 Effusion, right knee: Secondary | ICD-10-CM | POA: Diagnosis not present

## 2020-10-02 DIAGNOSIS — F419 Anxiety disorder, unspecified: Secondary | ICD-10-CM | POA: Diagnosis not present

## 2020-10-02 DIAGNOSIS — M069 Rheumatoid arthritis, unspecified: Secondary | ICD-10-CM | POA: Diagnosis not present

## 2020-10-02 DIAGNOSIS — F331 Major depressive disorder, recurrent, moderate: Secondary | ICD-10-CM | POA: Diagnosis not present

## 2020-10-02 DIAGNOSIS — Z87891 Personal history of nicotine dependence: Secondary | ICD-10-CM | POA: Diagnosis not present

## 2020-10-02 DIAGNOSIS — I10 Essential (primary) hypertension: Secondary | ICD-10-CM | POA: Diagnosis not present

## 2020-10-04 DIAGNOSIS — M069 Rheumatoid arthritis, unspecified: Secondary | ICD-10-CM | POA: Diagnosis not present

## 2020-10-04 DIAGNOSIS — F331 Major depressive disorder, recurrent, moderate: Secondary | ICD-10-CM | POA: Diagnosis not present

## 2020-10-04 DIAGNOSIS — Z87891 Personal history of nicotine dependence: Secondary | ICD-10-CM | POA: Diagnosis not present

## 2020-10-04 DIAGNOSIS — I10 Essential (primary) hypertension: Secondary | ICD-10-CM | POA: Diagnosis not present

## 2020-10-04 DIAGNOSIS — M25461 Effusion, right knee: Secondary | ICD-10-CM | POA: Diagnosis not present

## 2020-10-04 DIAGNOSIS — F419 Anxiety disorder, unspecified: Secondary | ICD-10-CM | POA: Diagnosis not present

## 2020-10-05 DIAGNOSIS — Z87891 Personal history of nicotine dependence: Secondary | ICD-10-CM | POA: Diagnosis not present

## 2020-10-05 DIAGNOSIS — M25461 Effusion, right knee: Secondary | ICD-10-CM | POA: Diagnosis not present

## 2020-10-05 DIAGNOSIS — M069 Rheumatoid arthritis, unspecified: Secondary | ICD-10-CM | POA: Diagnosis not present

## 2020-10-05 DIAGNOSIS — F331 Major depressive disorder, recurrent, moderate: Secondary | ICD-10-CM | POA: Diagnosis not present

## 2020-10-05 DIAGNOSIS — I10 Essential (primary) hypertension: Secondary | ICD-10-CM | POA: Diagnosis not present

## 2020-10-05 DIAGNOSIS — F419 Anxiety disorder, unspecified: Secondary | ICD-10-CM | POA: Diagnosis not present

## 2020-10-09 DIAGNOSIS — F331 Major depressive disorder, recurrent, moderate: Secondary | ICD-10-CM | POA: Diagnosis not present

## 2020-10-09 DIAGNOSIS — F419 Anxiety disorder, unspecified: Secondary | ICD-10-CM | POA: Diagnosis not present

## 2020-10-09 DIAGNOSIS — M25461 Effusion, right knee: Secondary | ICD-10-CM | POA: Diagnosis not present

## 2020-10-09 DIAGNOSIS — M069 Rheumatoid arthritis, unspecified: Secondary | ICD-10-CM | POA: Diagnosis not present

## 2020-10-09 DIAGNOSIS — I10 Essential (primary) hypertension: Secondary | ICD-10-CM | POA: Diagnosis not present

## 2020-10-09 DIAGNOSIS — Z87891 Personal history of nicotine dependence: Secondary | ICD-10-CM | POA: Diagnosis not present

## 2020-10-11 DIAGNOSIS — M069 Rheumatoid arthritis, unspecified: Secondary | ICD-10-CM | POA: Diagnosis not present

## 2020-10-11 DIAGNOSIS — F419 Anxiety disorder, unspecified: Secondary | ICD-10-CM | POA: Diagnosis not present

## 2020-10-11 DIAGNOSIS — F331 Major depressive disorder, recurrent, moderate: Secondary | ICD-10-CM | POA: Diagnosis not present

## 2020-10-11 DIAGNOSIS — Z87891 Personal history of nicotine dependence: Secondary | ICD-10-CM | POA: Diagnosis not present

## 2020-10-11 DIAGNOSIS — Z03818 Encounter for observation for suspected exposure to other biological agents ruled out: Secondary | ICD-10-CM | POA: Diagnosis not present

## 2020-10-11 DIAGNOSIS — M25461 Effusion, right knee: Secondary | ICD-10-CM | POA: Diagnosis not present

## 2020-10-11 DIAGNOSIS — I10 Essential (primary) hypertension: Secondary | ICD-10-CM | POA: Diagnosis not present

## 2020-10-12 DIAGNOSIS — I10 Essential (primary) hypertension: Secondary | ICD-10-CM | POA: Diagnosis not present

## 2020-10-12 DIAGNOSIS — M25461 Effusion, right knee: Secondary | ICD-10-CM | POA: Diagnosis not present

## 2020-10-12 DIAGNOSIS — M069 Rheumatoid arthritis, unspecified: Secondary | ICD-10-CM | POA: Diagnosis not present

## 2020-10-12 DIAGNOSIS — F331 Major depressive disorder, recurrent, moderate: Secondary | ICD-10-CM | POA: Diagnosis not present

## 2020-10-12 DIAGNOSIS — F419 Anxiety disorder, unspecified: Secondary | ICD-10-CM | POA: Diagnosis not present

## 2020-10-12 DIAGNOSIS — Z87891 Personal history of nicotine dependence: Secondary | ICD-10-CM | POA: Diagnosis not present

## 2020-10-13 DIAGNOSIS — F419 Anxiety disorder, unspecified: Secondary | ICD-10-CM | POA: Diagnosis not present

## 2020-10-13 DIAGNOSIS — Z87891 Personal history of nicotine dependence: Secondary | ICD-10-CM | POA: Diagnosis not present

## 2020-10-13 DIAGNOSIS — M069 Rheumatoid arthritis, unspecified: Secondary | ICD-10-CM | POA: Diagnosis not present

## 2020-10-13 DIAGNOSIS — F331 Major depressive disorder, recurrent, moderate: Secondary | ICD-10-CM | POA: Diagnosis not present

## 2020-10-13 DIAGNOSIS — M25461 Effusion, right knee: Secondary | ICD-10-CM | POA: Diagnosis not present

## 2020-10-13 DIAGNOSIS — I10 Essential (primary) hypertension: Secondary | ICD-10-CM | POA: Diagnosis not present

## 2020-10-16 DIAGNOSIS — M069 Rheumatoid arthritis, unspecified: Secondary | ICD-10-CM | POA: Diagnosis not present

## 2020-10-16 DIAGNOSIS — M4800 Spinal stenosis, site unspecified: Secondary | ICD-10-CM | POA: Diagnosis not present

## 2020-10-16 DIAGNOSIS — Z87891 Personal history of nicotine dependence: Secondary | ICD-10-CM | POA: Diagnosis not present

## 2020-10-16 DIAGNOSIS — F331 Major depressive disorder, recurrent, moderate: Secondary | ICD-10-CM | POA: Diagnosis not present

## 2020-10-16 DIAGNOSIS — F419 Anxiety disorder, unspecified: Secondary | ICD-10-CM | POA: Diagnosis not present

## 2020-10-16 DIAGNOSIS — I10 Essential (primary) hypertension: Secondary | ICD-10-CM | POA: Diagnosis not present

## 2020-10-16 DIAGNOSIS — K5901 Slow transit constipation: Secondary | ICD-10-CM | POA: Diagnosis not present

## 2020-10-16 DIAGNOSIS — M25461 Effusion, right knee: Secondary | ICD-10-CM | POA: Diagnosis not present

## 2020-10-17 DIAGNOSIS — Z03818 Encounter for observation for suspected exposure to other biological agents ruled out: Secondary | ICD-10-CM | POA: Diagnosis not present

## 2020-10-17 DIAGNOSIS — F331 Major depressive disorder, recurrent, moderate: Secondary | ICD-10-CM | POA: Diagnosis not present

## 2020-10-17 DIAGNOSIS — F419 Anxiety disorder, unspecified: Secondary | ICD-10-CM | POA: Diagnosis not present

## 2020-10-17 DIAGNOSIS — M25461 Effusion, right knee: Secondary | ICD-10-CM | POA: Diagnosis not present

## 2020-10-17 DIAGNOSIS — I10 Essential (primary) hypertension: Secondary | ICD-10-CM | POA: Diagnosis not present

## 2020-10-17 DIAGNOSIS — M069 Rheumatoid arthritis, unspecified: Secondary | ICD-10-CM | POA: Diagnosis not present

## 2020-10-17 DIAGNOSIS — Z87891 Personal history of nicotine dependence: Secondary | ICD-10-CM | POA: Diagnosis not present

## 2020-10-18 DIAGNOSIS — F419 Anxiety disorder, unspecified: Secondary | ICD-10-CM | POA: Diagnosis not present

## 2020-10-18 DIAGNOSIS — Z87891 Personal history of nicotine dependence: Secondary | ICD-10-CM | POA: Diagnosis not present

## 2020-10-18 DIAGNOSIS — K5901 Slow transit constipation: Secondary | ICD-10-CM | POA: Diagnosis not present

## 2020-10-18 DIAGNOSIS — M069 Rheumatoid arthritis, unspecified: Secondary | ICD-10-CM | POA: Diagnosis not present

## 2020-10-18 DIAGNOSIS — M25461 Effusion, right knee: Secondary | ICD-10-CM | POA: Diagnosis not present

## 2020-10-18 DIAGNOSIS — I10 Essential (primary) hypertension: Secondary | ICD-10-CM | POA: Diagnosis not present

## 2020-10-18 DIAGNOSIS — F331 Major depressive disorder, recurrent, moderate: Secondary | ICD-10-CM | POA: Diagnosis not present

## 2020-10-19 DIAGNOSIS — M069 Rheumatoid arthritis, unspecified: Secondary | ICD-10-CM | POA: Diagnosis not present

## 2020-10-19 DIAGNOSIS — Z87891 Personal history of nicotine dependence: Secondary | ICD-10-CM | POA: Diagnosis not present

## 2020-10-19 DIAGNOSIS — M25461 Effusion, right knee: Secondary | ICD-10-CM | POA: Diagnosis not present

## 2020-10-19 DIAGNOSIS — I10 Essential (primary) hypertension: Secondary | ICD-10-CM | POA: Diagnosis not present

## 2020-10-19 DIAGNOSIS — F331 Major depressive disorder, recurrent, moderate: Secondary | ICD-10-CM | POA: Diagnosis not present

## 2020-10-19 DIAGNOSIS — F419 Anxiety disorder, unspecified: Secondary | ICD-10-CM | POA: Diagnosis not present

## 2020-10-23 DIAGNOSIS — M25461 Effusion, right knee: Secondary | ICD-10-CM | POA: Diagnosis not present

## 2020-10-23 DIAGNOSIS — I10 Essential (primary) hypertension: Secondary | ICD-10-CM | POA: Diagnosis not present

## 2020-10-23 DIAGNOSIS — Z87891 Personal history of nicotine dependence: Secondary | ICD-10-CM | POA: Diagnosis not present

## 2020-10-23 DIAGNOSIS — F419 Anxiety disorder, unspecified: Secondary | ICD-10-CM | POA: Diagnosis not present

## 2020-10-23 DIAGNOSIS — F331 Major depressive disorder, recurrent, moderate: Secondary | ICD-10-CM | POA: Diagnosis not present

## 2020-10-23 DIAGNOSIS — M069 Rheumatoid arthritis, unspecified: Secondary | ICD-10-CM | POA: Diagnosis not present

## 2020-10-24 DIAGNOSIS — R059 Cough, unspecified: Secondary | ICD-10-CM | POA: Diagnosis not present

## 2020-10-25 DIAGNOSIS — Z87891 Personal history of nicotine dependence: Secondary | ICD-10-CM | POA: Diagnosis not present

## 2020-10-25 DIAGNOSIS — F419 Anxiety disorder, unspecified: Secondary | ICD-10-CM | POA: Diagnosis not present

## 2020-10-25 DIAGNOSIS — I10 Essential (primary) hypertension: Secondary | ICD-10-CM | POA: Diagnosis not present

## 2020-10-25 DIAGNOSIS — F331 Major depressive disorder, recurrent, moderate: Secondary | ICD-10-CM | POA: Diagnosis not present

## 2020-10-25 DIAGNOSIS — M25461 Effusion, right knee: Secondary | ICD-10-CM | POA: Diagnosis not present

## 2020-10-25 DIAGNOSIS — M069 Rheumatoid arthritis, unspecified: Secondary | ICD-10-CM | POA: Diagnosis not present

## 2020-10-25 DIAGNOSIS — Z03818 Encounter for observation for suspected exposure to other biological agents ruled out: Secondary | ICD-10-CM | POA: Diagnosis not present

## 2020-10-27 DIAGNOSIS — R2689 Other abnormalities of gait and mobility: Secondary | ICD-10-CM | POA: Diagnosis not present

## 2020-10-27 DIAGNOSIS — L851 Acquired keratosis [keratoderma] palmaris et plantaris: Secondary | ICD-10-CM | POA: Diagnosis not present

## 2020-10-27 DIAGNOSIS — M19079 Primary osteoarthritis, unspecified ankle and foot: Secondary | ICD-10-CM | POA: Diagnosis not present

## 2020-10-27 DIAGNOSIS — M79673 Pain in unspecified foot: Secondary | ICD-10-CM | POA: Diagnosis not present

## 2020-10-27 DIAGNOSIS — B351 Tinea unguium: Secondary | ICD-10-CM | POA: Diagnosis not present

## 2020-10-27 DIAGNOSIS — L603 Nail dystrophy: Secondary | ICD-10-CM | POA: Diagnosis not present

## 2020-10-28 DIAGNOSIS — F419 Anxiety disorder, unspecified: Secondary | ICD-10-CM | POA: Diagnosis not present

## 2020-10-28 DIAGNOSIS — F331 Major depressive disorder, recurrent, moderate: Secondary | ICD-10-CM | POA: Diagnosis not present

## 2020-10-28 DIAGNOSIS — Z7952 Long term (current) use of systemic steroids: Secondary | ICD-10-CM | POA: Diagnosis not present

## 2020-10-28 DIAGNOSIS — Z87891 Personal history of nicotine dependence: Secondary | ICD-10-CM | POA: Diagnosis not present

## 2020-10-28 DIAGNOSIS — M069 Rheumatoid arthritis, unspecified: Secondary | ICD-10-CM | POA: Diagnosis not present

## 2020-10-28 DIAGNOSIS — I10 Essential (primary) hypertension: Secondary | ICD-10-CM | POA: Diagnosis not present

## 2020-10-28 DIAGNOSIS — M25461 Effusion, right knee: Secondary | ICD-10-CM | POA: Diagnosis not present

## 2020-10-30 DIAGNOSIS — M25461 Effusion, right knee: Secondary | ICD-10-CM | POA: Diagnosis not present

## 2020-10-30 DIAGNOSIS — F419 Anxiety disorder, unspecified: Secondary | ICD-10-CM | POA: Diagnosis not present

## 2020-10-30 DIAGNOSIS — M069 Rheumatoid arthritis, unspecified: Secondary | ICD-10-CM | POA: Diagnosis not present

## 2020-10-30 DIAGNOSIS — Z87891 Personal history of nicotine dependence: Secondary | ICD-10-CM | POA: Diagnosis not present

## 2020-10-30 DIAGNOSIS — F331 Major depressive disorder, recurrent, moderate: Secondary | ICD-10-CM | POA: Diagnosis not present

## 2020-10-30 DIAGNOSIS — I10 Essential (primary) hypertension: Secondary | ICD-10-CM | POA: Diagnosis not present

## 2020-10-31 DIAGNOSIS — F419 Anxiety disorder, unspecified: Secondary | ICD-10-CM | POA: Diagnosis not present

## 2020-10-31 DIAGNOSIS — Z87891 Personal history of nicotine dependence: Secondary | ICD-10-CM | POA: Diagnosis not present

## 2020-10-31 DIAGNOSIS — F331 Major depressive disorder, recurrent, moderate: Secondary | ICD-10-CM | POA: Diagnosis not present

## 2020-10-31 DIAGNOSIS — M069 Rheumatoid arthritis, unspecified: Secondary | ICD-10-CM | POA: Diagnosis not present

## 2020-10-31 DIAGNOSIS — M25461 Effusion, right knee: Secondary | ICD-10-CM | POA: Diagnosis not present

## 2020-10-31 DIAGNOSIS — I10 Essential (primary) hypertension: Secondary | ICD-10-CM | POA: Diagnosis not present

## 2020-11-02 DIAGNOSIS — F331 Major depressive disorder, recurrent, moderate: Secondary | ICD-10-CM | POA: Diagnosis not present

## 2020-11-02 DIAGNOSIS — M069 Rheumatoid arthritis, unspecified: Secondary | ICD-10-CM | POA: Diagnosis not present

## 2020-11-02 DIAGNOSIS — Z87891 Personal history of nicotine dependence: Secondary | ICD-10-CM | POA: Diagnosis not present

## 2020-11-02 DIAGNOSIS — M25461 Effusion, right knee: Secondary | ICD-10-CM | POA: Diagnosis not present

## 2020-11-02 DIAGNOSIS — F419 Anxiety disorder, unspecified: Secondary | ICD-10-CM | POA: Diagnosis not present

## 2020-11-02 DIAGNOSIS — I10 Essential (primary) hypertension: Secondary | ICD-10-CM | POA: Diagnosis not present

## 2020-11-06 DIAGNOSIS — Z0000 Encounter for general adult medical examination without abnormal findings: Secondary | ICD-10-CM | POA: Diagnosis not present

## 2020-11-06 DIAGNOSIS — Z87891 Personal history of nicotine dependence: Secondary | ICD-10-CM | POA: Diagnosis not present

## 2020-11-06 DIAGNOSIS — M069 Rheumatoid arthritis, unspecified: Secondary | ICD-10-CM | POA: Diagnosis not present

## 2020-11-06 DIAGNOSIS — F331 Major depressive disorder, recurrent, moderate: Secondary | ICD-10-CM | POA: Diagnosis not present

## 2020-11-06 DIAGNOSIS — F419 Anxiety disorder, unspecified: Secondary | ICD-10-CM | POA: Diagnosis not present

## 2020-11-06 DIAGNOSIS — I10 Essential (primary) hypertension: Secondary | ICD-10-CM | POA: Diagnosis not present

## 2020-11-06 DIAGNOSIS — M25461 Effusion, right knee: Secondary | ICD-10-CM | POA: Diagnosis not present

## 2020-11-08 DIAGNOSIS — M069 Rheumatoid arthritis, unspecified: Secondary | ICD-10-CM | POA: Diagnosis not present

## 2020-11-08 DIAGNOSIS — F331 Major depressive disorder, recurrent, moderate: Secondary | ICD-10-CM | POA: Diagnosis not present

## 2020-11-08 DIAGNOSIS — Z87891 Personal history of nicotine dependence: Secondary | ICD-10-CM | POA: Diagnosis not present

## 2020-11-08 DIAGNOSIS — F419 Anxiety disorder, unspecified: Secondary | ICD-10-CM | POA: Diagnosis not present

## 2020-11-08 DIAGNOSIS — I10 Essential (primary) hypertension: Secondary | ICD-10-CM | POA: Diagnosis not present

## 2020-11-08 DIAGNOSIS — M25461 Effusion, right knee: Secondary | ICD-10-CM | POA: Diagnosis not present

## 2020-11-09 DIAGNOSIS — M069 Rheumatoid arthritis, unspecified: Secondary | ICD-10-CM | POA: Diagnosis not present

## 2020-11-09 DIAGNOSIS — M25461 Effusion, right knee: Secondary | ICD-10-CM | POA: Diagnosis not present

## 2020-11-09 DIAGNOSIS — Z87891 Personal history of nicotine dependence: Secondary | ICD-10-CM | POA: Diagnosis not present

## 2020-11-09 DIAGNOSIS — I10 Essential (primary) hypertension: Secondary | ICD-10-CM | POA: Diagnosis not present

## 2020-11-09 DIAGNOSIS — F331 Major depressive disorder, recurrent, moderate: Secondary | ICD-10-CM | POA: Diagnosis not present

## 2020-11-09 DIAGNOSIS — F419 Anxiety disorder, unspecified: Secondary | ICD-10-CM | POA: Diagnosis not present

## 2020-11-13 DIAGNOSIS — Z87891 Personal history of nicotine dependence: Secondary | ICD-10-CM | POA: Diagnosis not present

## 2020-11-13 DIAGNOSIS — F419 Anxiety disorder, unspecified: Secondary | ICD-10-CM | POA: Diagnosis not present

## 2020-11-13 DIAGNOSIS — M25461 Effusion, right knee: Secondary | ICD-10-CM | POA: Diagnosis not present

## 2020-11-13 DIAGNOSIS — I10 Essential (primary) hypertension: Secondary | ICD-10-CM | POA: Diagnosis not present

## 2020-11-13 DIAGNOSIS — M069 Rheumatoid arthritis, unspecified: Secondary | ICD-10-CM | POA: Diagnosis not present

## 2020-11-13 DIAGNOSIS — F331 Major depressive disorder, recurrent, moderate: Secondary | ICD-10-CM | POA: Diagnosis not present

## 2020-11-13 DIAGNOSIS — G894 Chronic pain syndrome: Secondary | ICD-10-CM | POA: Diagnosis not present

## 2020-11-15 DIAGNOSIS — I10 Essential (primary) hypertension: Secondary | ICD-10-CM | POA: Diagnosis not present

## 2020-11-15 DIAGNOSIS — F331 Major depressive disorder, recurrent, moderate: Secondary | ICD-10-CM | POA: Diagnosis not present

## 2020-11-15 DIAGNOSIS — Z87891 Personal history of nicotine dependence: Secondary | ICD-10-CM | POA: Diagnosis not present

## 2020-11-15 DIAGNOSIS — M069 Rheumatoid arthritis, unspecified: Secondary | ICD-10-CM | POA: Diagnosis not present

## 2020-11-15 DIAGNOSIS — F419 Anxiety disorder, unspecified: Secondary | ICD-10-CM | POA: Diagnosis not present

## 2020-11-15 DIAGNOSIS — M25461 Effusion, right knee: Secondary | ICD-10-CM | POA: Diagnosis not present

## 2020-11-20 DIAGNOSIS — R600 Localized edema: Secondary | ICD-10-CM | POA: Diagnosis not present

## 2020-11-20 DIAGNOSIS — F331 Major depressive disorder, recurrent, moderate: Secondary | ICD-10-CM | POA: Diagnosis not present

## 2020-11-20 DIAGNOSIS — Z87891 Personal history of nicotine dependence: Secondary | ICD-10-CM | POA: Diagnosis not present

## 2020-11-20 DIAGNOSIS — F419 Anxiety disorder, unspecified: Secondary | ICD-10-CM | POA: Diagnosis not present

## 2020-11-20 DIAGNOSIS — K5901 Slow transit constipation: Secondary | ICD-10-CM | POA: Diagnosis not present

## 2020-11-20 DIAGNOSIS — I10 Essential (primary) hypertension: Secondary | ICD-10-CM | POA: Diagnosis not present

## 2020-11-20 DIAGNOSIS — M25461 Effusion, right knee: Secondary | ICD-10-CM | POA: Diagnosis not present

## 2020-11-20 DIAGNOSIS — F5101 Primary insomnia: Secondary | ICD-10-CM | POA: Diagnosis not present

## 2020-11-20 DIAGNOSIS — M069 Rheumatoid arthritis, unspecified: Secondary | ICD-10-CM | POA: Diagnosis not present

## 2020-11-22 DIAGNOSIS — Z87891 Personal history of nicotine dependence: Secondary | ICD-10-CM | POA: Diagnosis not present

## 2020-11-22 DIAGNOSIS — I10 Essential (primary) hypertension: Secondary | ICD-10-CM | POA: Diagnosis not present

## 2020-11-22 DIAGNOSIS — M069 Rheumatoid arthritis, unspecified: Secondary | ICD-10-CM | POA: Diagnosis not present

## 2020-11-22 DIAGNOSIS — F331 Major depressive disorder, recurrent, moderate: Secondary | ICD-10-CM | POA: Diagnosis not present

## 2020-11-22 DIAGNOSIS — F419 Anxiety disorder, unspecified: Secondary | ICD-10-CM | POA: Diagnosis not present

## 2020-11-22 DIAGNOSIS — M25461 Effusion, right knee: Secondary | ICD-10-CM | POA: Diagnosis not present

## 2020-11-27 ENCOUNTER — Ambulatory Visit: Payer: Self-pay | Admitting: Cardiology

## 2020-11-30 ENCOUNTER — Emergency Department (HOSPITAL_COMMUNITY): Payer: Medicare Other

## 2020-11-30 ENCOUNTER — Encounter (HOSPITAL_COMMUNITY): Payer: Self-pay | Admitting: Emergency Medicine

## 2020-11-30 ENCOUNTER — Emergency Department (HOSPITAL_COMMUNITY)
Admission: EM | Admit: 2020-11-30 | Discharge: 2020-11-30 | Disposition: A | Discharge status: Home / Self Care | Payer: Medicare Other | Attending: Emergency Medicine | Admitting: Emergency Medicine

## 2020-11-30 ENCOUNTER — Other Ambulatory Visit: Payer: Self-pay

## 2020-11-30 ENCOUNTER — Encounter (HOSPITAL_COMMUNITY): Payer: Self-pay

## 2020-11-30 DIAGNOSIS — S2241XA Multiple fractures of ribs, right side, initial encounter for closed fracture: Secondary | ICD-10-CM | POA: Diagnosis not present

## 2020-11-30 DIAGNOSIS — K449 Diaphragmatic hernia without obstruction or gangrene: Secondary | ICD-10-CM | POA: Diagnosis not present

## 2020-11-30 DIAGNOSIS — Z20822 Contact with and (suspected) exposure to covid-19: Secondary | ICD-10-CM | POA: Diagnosis not present

## 2020-11-30 DIAGNOSIS — M545 Low back pain, unspecified: Secondary | ICD-10-CM | POA: Insufficient documentation

## 2020-11-30 DIAGNOSIS — S0990XA Unspecified injury of head, initial encounter: Secondary | ICD-10-CM | POA: Insufficient documentation

## 2020-11-30 DIAGNOSIS — R0902 Hypoxemia: Secondary | ICD-10-CM | POA: Diagnosis not present

## 2020-11-30 DIAGNOSIS — W01198A Fall on same level from slipping, tripping and stumbling with subsequent striking against other object, initial encounter: Secondary | ICD-10-CM | POA: Insufficient documentation

## 2020-11-30 DIAGNOSIS — I700 Atherosclerosis of aorta: Secondary | ICD-10-CM | POA: Diagnosis not present

## 2020-11-30 DIAGNOSIS — Z7401 Bed confinement status: Secondary | ICD-10-CM | POA: Diagnosis not present

## 2020-11-30 DIAGNOSIS — R9431 Abnormal electrocardiogram [ECG] [EKG]: Secondary | ICD-10-CM | POA: Diagnosis not present

## 2020-11-30 DIAGNOSIS — R079 Chest pain, unspecified: Secondary | ICD-10-CM | POA: Diagnosis not present

## 2020-11-30 DIAGNOSIS — S299XXA Unspecified injury of thorax, initial encounter: Secondary | ICD-10-CM | POA: Diagnosis present

## 2020-11-30 DIAGNOSIS — R519 Headache, unspecified: Secondary | ICD-10-CM | POA: Diagnosis not present

## 2020-11-30 DIAGNOSIS — M542 Cervicalgia: Secondary | ICD-10-CM | POA: Diagnosis not present

## 2020-11-30 DIAGNOSIS — J9811 Atelectasis: Secondary | ICD-10-CM | POA: Diagnosis not present

## 2020-11-30 DIAGNOSIS — W19XXXA Unspecified fall, initial encounter: Secondary | ICD-10-CM | POA: Diagnosis not present

## 2020-11-30 LAB — COMPREHENSIVE METABOLIC PANEL
ALT: 15 U/L (ref 0–44)
AST: 18 U/L (ref 15–41)
Albumin: 3.7 g/dL (ref 3.5–5.0)
Alkaline Phosphatase: 123 U/L (ref 38–126)
Anion gap: 9 (ref 5–15)
BUN: 15 mg/dL (ref 8–23)
CO2: 21 mmol/L — ABNORMAL LOW (ref 22–32)
Calcium: 9.7 mg/dL (ref 8.9–10.3)
Chloride: 107 mmol/L (ref 98–111)
Creatinine, Ser: 0.93 mg/dL (ref 0.44–1.00)
GFR, Estimated: 59 mL/min — ABNORMAL LOW (ref >60)
Glucose, Bld: 129 mg/dL — ABNORMAL HIGH (ref 70–99)
Potassium: 3.5 mmol/L (ref 3.5–5.1)
Sodium: 137 mmol/L (ref 135–145)
Total Bilirubin: 0.7 mg/dL (ref 0.3–1.2)
Total Protein: 6.6 g/dL (ref 6.5–8.1)

## 2020-11-30 LAB — RESP PANEL BY RT-PCR (FLU A&B, COVID) ARPGX2
Influenza A by PCR: NEGATIVE
Influenza B by PCR: NEGATIVE
SARS Coronavirus 2 by RT PCR: NEGATIVE

## 2020-11-30 LAB — CBC
HCT: 38.2 % (ref 36.0–46.0)
Hemoglobin: 11.3 g/dL — ABNORMAL LOW (ref 12.0–15.0)
MCH: 27.5 pg (ref 26.0–34.0)
MCHC: 29.6 g/dL — ABNORMAL LOW (ref 30.0–36.0)
MCV: 92.9 fL (ref 80.0–100.0)
Platelets: 220 10*3/uL (ref 150–400)
RBC: 4.11 MIL/uL (ref 3.87–5.11)
RDW: 18.9 % — ABNORMAL HIGH (ref 11.5–15.5)
WBC: 11.8 10*3/uL — ABNORMAL HIGH (ref 4.0–10.5)
nRBC: 0.2 % (ref 0.0–0.2)

## 2020-11-30 LAB — PROTIME-INR
INR: 1.2 (ref 0.8–1.2)
Prothrombin Time: 15.2 seconds (ref 11.4–15.2)

## 2020-11-30 NOTE — ED Notes (Signed)
Patient transported to CT scan . 

## 2020-11-30 NOTE — Progress Notes (Signed)
Orthopedic Tech Progress Note Patient Details:  [name] 04/08/1873 [id number] Level 2 trauma Patient ID: [name], female   DOB: 04/08/1873, 87 y.o.   MRN: [id number]  [name] 11/30/2020, 12:36 AM

## 2020-11-30 NOTE — ED Notes (Signed)
Report given to [facility] nursing [facility] , PTAR notified by Diplomatic Services operational officer for transport.

## 2020-11-30 NOTE — ED Triage Notes (Signed)
Patient lost her balance and fell this evening at [facility] nursing home , hit her head against a wall , presents with upper nasal swelling/bruise and skin tear at elbows.

## 2020-11-30 NOTE — Discharge Instructions (Addendum)
You were evaluated in the Emergency Department and after careful evaluation, we did not find any emergent condition requiring admission or further testing in the hospital.  Your exam/testing today was overall reassuring.  Testing today has showed rib fractures from the fall.  It is important to take deep breaths throughout the day to keep your lungs healthy.  Recommend Tylenol or Motrin at home for pain.  Please return to the Emergency Department if you experience any worsening of your condition.  Thank you for allowing [place] to be a part of your care.

## 2020-11-30 NOTE — Consult Note (Signed)
Reason for Consult/Chief Complaint: rib frx Consultant: Pilar Plate, MD  [name] is an 87 y.o. female.   HPI: 9F s/p fall out of her wheelchair while attempting to get into bed. Denies LOC. Reports that this occurred around 2000 on 8/23 and despite calling for help, was not seen by any staff until 0900 on 8/24. She was able to ambulate and get herself back into bed for the evening. Most significant pain is in her R hip which she states is stable and c/w her chronic arthritic pain. She reports 3-4 weeks ago, she was de-escalated from use of a walker to using a wheelchair by her physical therapist.   History reviewed. No pertinent past medical history.  History reviewed. No pertinent surgical history.  No family history on file.  Social History:  has no history on file for tobacco use, alcohol use, and drug use.  Allergies: No Known Allergies  Medications: I have reviewed the patient's current medications.  Results for orders placed or performed during the hospital encounter of 11/30/20 (from the past 48 hour(s))  Resp Panel by RT-PCR (Flu A&B, Covid) Nasopharyngeal Swab     Status: None   Collection Time: 11/30/20 12:48 AM   Specimen: Nasopharyngeal Swab; Nasopharyngeal(NP) swabs in vial transport medium  Result Value Ref Range   SARS Coronavirus 2 by RT PCR NEGATIVE NEGATIVE    Comment: (NOTE) SARS-CoV-2 target nucleic acids are NOT DETECTED.  The SARS-CoV-2 RNA is generally detectable in upper respiratory specimens during the acute phase of infection. The lowest concentration of SARS-CoV-2 viral copies this assay can detect is 138 copies/mL. A negative result does not preclude SARS-Cov-2 infection and should not be used as the sole basis for treatment or other patient management decisions. A negative result may occur with  improper specimen collection/handling, submission of specimen other than nasopharyngeal swab, presence of viral mutation(s) within the areas  targeted by this assay, and inadequate number of viral copies(<138 copies/mL). A negative result must be combined with clinical observations, patient history, and epidemiological information. The expected result is Negative.  Fact Sheet for Patients:  BloggerCourse.com  Fact Sheet for Healthcare Providers:  SeriousBroker.it  This test is no t yet approved or cleared by the [place] FDA and  has been authorized for detection and/or diagnosis of SARS-CoV-2 by FDA under an Emergency Use Authorization (EUA). This EUA will remain  in effect (meaning this test can be used) for the duration of the COVID-19 declaration under Section 564(b)(1) of the Act, 21 U.S.C.section 360bbb-3(b)(1), unless the authorization is terminated  or revoked sooner.       Influenza A by PCR NEGATIVE NEGATIVE   Influenza B by PCR NEGATIVE NEGATIVE    Comment: (NOTE) The Xpert Xpress SARS-CoV-2/FLU/RSV plus assay is intended as an aid in the diagnosis of influenza from Nasopharyngeal swab specimens and should not be used as a sole basis for treatment. Nasal washings and aspirates are unacceptable for Xpert Xpress SARS-CoV-2/FLU/RSV testing.  Fact Sheet for Patients: BloggerCourse.com  Fact Sheet for Healthcare Providers: SeriousBroker.it  This test is not yet approved or cleared by the [place] FDA and has been authorized for detection and/or diagnosis of SARS-CoV-2 by FDA under an Emergency Use Authorization (EUA). This EUA will remain in effect (meaning this test can be used) for the duration of the COVID-19 declaration under Section 564(b)(1) of the Act, 21 U.S.C. section 360bbb-3(b)(1), unless the authorization is terminated or revoked.  Performed at [facility], [address] [place], [place] [address]   CBC     Status: Abnormal   Collection Time: 11/30/20 12:49 AM  Result Value  Ref Range   WBC 11.8 (H) 4.0 - 10.5 K/uL   RBC 4.11 3.87 - 5.11 MIL/uL   Hemoglobin 11.3 (L) 12.0 - 15.0 g/dL   HCT 62.1 30.8 - 65.7 %   MCV 92.9 80.0 - 100.0 fL   MCH 27.5 26.0 - 34.0 pg   MCHC 29.6 (L) 30.0 - 36.0 g/dL   RDW 84.6 (H) 96.2 - 95.2 %   Platelets 220 150 - 400 K/uL   nRBC 0.2 0.0 - 0.2 %    Comment: Performed at [facility], [address], [place], [place] [address]  Comprehensive metabolic panel     Status: Abnormal   Collection Time: 11/30/20 12:49 AM  Result Value Ref Range   Sodium 137 135 - 145 mmol/L   Potassium 3.5 3.5 - 5.1 mmol/L   Chloride 107 98 - 111 mmol/L   CO2 21 (L) 22 - 32 mmol/L   Glucose, Bld 129 (H) 70 - 99 mg/dL    Comment: Glucose reference range applies only to samples taken after fasting for at least 8 hours.   BUN 15 8 - 23 mg/dL   Creatinine, Ser 4.40 0.44 - 1.00 mg/dL   Calcium 9.7 8.9 - 10.2 mg/dL   Total Protein 6.6 6.5 - 8.1 g/dL   Albumin 3.7 3.5 - 5.0 g/dL   AST 18 15 - 41 U/L   ALT 15 0 - 44 U/L   Alkaline Phosphatase 123 38 - 126 U/L   Total Bilirubin 0.7 0.3 - 1.2 mg/dL   GFR, Estimated 59 (L) >60 mL/min    Comment: (NOTE) Calculated using the CKD-EPI Creatinine Equation (2021)    Anion gap 9 5 - 15    Comment: Performed at [facility], [address], [place], [place] [address]  Protime-INR     Status: None   Collection Time: 11/30/20 12:49 AM  Result Value Ref Range   Prothrombin Time 15.2 11.4 - 15.2 seconds   INR 1.2 0.8 - 1.2    Comment: (NOTE) INR goal varies based on device and disease states. Performed at [facility], [address], [place], [place] [address]     CT ABDOMEN PELVIS WO CONTRAST  Result Date: 11/30/2020 CLINICAL DATA:  87 year old female status post fall. Pain down the right side of her chest. EXAM: CT CHEST, ABDOMEN AND PELVIS WITHOUT CONTRAST TECHNIQUE: Multidetector CT imaging of the chest, abdomen and pelvis was performed following the standard protocol without IV  contrast. COMPARISON:  Portable chest radiograph 0046 hours. Cervical and lumbar spine CT earlier today. FINDINGS: CT CHEST FINDINGS Cardiovascular: Calcified aortic atherosclerosis. Mildly tortuous thoracic aorta. Calcified coronary artery atherosclerosis on series 3, image 36. Cardiac size at the upper limits of normal. No pericardial effusion. Mediastinum/Nodes: No mediastinal hematoma or lymphadenopathy is evident in the absence of IV contrast. There is a moderate size gastric hiatal hernia. Lungs/Pleura: Major airways are patent. There is atelectasis in the right lower lobe. No pneumothorax. No pleural effusion. No pulmonary contusion. Musculoskeletal: Visible shoulder osseous structures appear intact. Sternum appears intact. No acute left-sided rib fracture identified. There is a chronic appearing lateral right 9th rib fracture on series 5, image 63. And nearby subacute appearing fractures of the right lateral 8th, anterior 7th ribs (image 53), and anterior 6th rib (image 46). But superimposed acute fractures  of the posterior 9th rib (series 5, image 50) lateral 7th rib (same image) lateral 4th rib (image 28) and posterior 10th rib (image 56). Possible subtle nondisplaced posterior 11th and 12th right rib fractures better demonstrated on the lumbar spine CT. Mild thoracic scoliosis.  No thoracic vertebral fracture identified. CT ABDOMEN PELVIS FINDINGS Hepatobiliary: No perihepatic fluid. Negative noncontrast liver and gallbladder. Pancreas: Partially atrophied, otherwise negative. Spleen: No perisplenic fluid.  Negative noncontrast spleen. Adrenals/Urinary Tract: Adrenal glands are within normal limits. Kidneys are nonobstructed with occasional renal cortical scarring and punctate nephrolithiasis. No perinephric inflammation. Proximal ureters are decompressed. Unremarkable bladder. Stomach/Bowel: Decompressed but redundant large bowel throughout the pelvis. No dilated or inflamed large or small bowel. Cecum  is on a lax mesentery located in the anterior abdomen. Appendix not delineated. Aside from hiatal hernia the stomach appears negative. Negative duodenum. No free air or free fluid. Vascular/Lymphatic: Extensive Aortoiliac calcified atherosclerosis. Normal caliber abdominal aorta. No lymphadenopathy. Reproductive: Surgically absent uterus. Diminutive or absent ovaries. Other: No pelvic free fluid. Musculoskeletal: Lumbar spine is reported separately earlier today. Sacrum, SI joints, pelvis and proximal femurs appear intact. IMPRESSION: 1. Multilevel acute on subacute to chronic right rib fractures - with acute fractures of the ribs 4, 7, 9 and 10. Possible subtle right 11th and 12th rib fractures better demonstrated on the earlier Lumbar Spine CT. 2. Right lung atelectasis. No associated pneumothorax, pleural effusion, or pulmonary contusion. 3. No other acute traumatic injury identified in the absence of IV contrast. 4. Moderate size gastric hiatal hernia. Aortic Atherosclerosis (ICD10-I70.0). Electronically Signed   By: Odessa Fleming M.D.   On: 11/30/2020 04:11   CT HEAD WO CONTRAST ( )  Result Date: 11/30/2020 CLINICAL DATA:  Recent fall with headaches and neck pain, initial encounter EXAM: CT HEAD WITHOUT CONTRAST CT CERVICAL SPINE WITHOUT CONTRAST TECHNIQUE: Multidetector CT imaging of the head and cervical spine was performed following the standard protocol without intravenous contrast. Multiplanar CT image reconstructions of the cervical spine were also generated. COMPARISON:  None. FINDINGS: CT HEAD FINDINGS Brain: No evidence of acute infarction, hemorrhage, hydrocephalus, extra-axial collection or mass lesion/mass effect. Chronic atrophic and ischemic changes are noted. Large sooner infarct is noted within the right thalamus. Vascular: No hyperdense vessel or unexpected calcification. Skull: Normal. Negative for fracture or focal lesion. Sinuses/Orbits: No acute finding. Other: None. CT CERVICAL SPINE  FINDINGS Alignment: Mild anterolisthesis of C4 on C5 and C3 on C4 is seen. Skull base and vertebrae: 7 cervical segments are well visualized. Anterolisthesis is noted as described above secondary to facet hypertrophic changes. No acute fracture or acute facet abnormality is noted. Multilevel facet hypertrophic changes are noted. Osteophytic changes are noted at multiple levels. Soft tissues and spinal canal: Surrounding soft tissue structures are within normal limits. Upper chest: Visualized lung apices are within normal limits. Other: None IMPRESSION: CT of the head: Chronic atrophic and ischemic changes without acute abnormality. CT of the cervical spine: Multilevel degenerative changes are noted without acute abnormality. Mild degenerative anterolisthesis is seen as described. Electronically Signed   By: Alcide Clever M.D.   On: 11/30/2020 01:33   CT Chest Wo Contrast  Result Date: 11/30/2020 CLINICAL DATA:  87 year old female status post fall. Pain down the right side of her chest. EXAM: CT CHEST, ABDOMEN AND PELVIS WITHOUT CONTRAST TECHNIQUE: Multidetector CT imaging of the chest, abdomen and pelvis was performed following the standard protocol without IV contrast. COMPARISON:  Portable chest radiograph 0046 hours. Cervical and lumbar spine CT earlier  today. FINDINGS: CT CHEST FINDINGS Cardiovascular: Calcified aortic atherosclerosis. Mildly tortuous thoracic aorta. Calcified coronary artery atherosclerosis on series 3, image 36. Cardiac size at the upper limits of normal. No pericardial effusion. Mediastinum/Nodes: No mediastinal hematoma or lymphadenopathy is evident in the absence of IV contrast. There is a moderate size gastric hiatal hernia. Lungs/Pleura: Major airways are patent. There is atelectasis in the right lower lobe. No pneumothorax. No pleural effusion. No pulmonary contusion. Musculoskeletal: Visible shoulder osseous structures appear intact. Sternum appears intact. No acute left-sided rib  fracture identified. There is a chronic appearing lateral right 9th rib fracture on series 5, image 63. And nearby subacute appearing fractures of the right lateral 8th, anterior 7th ribs (image 53), and anterior 6th rib (image 46). But superimposed acute fractures of the posterior 9th rib (series 5, image 50) lateral 7th rib (same image) lateral 4th rib (image 28) and posterior 10th rib (image 56). Possible subtle nondisplaced posterior 11th and 12th right rib fractures better demonstrated on the lumbar spine CT. Mild thoracic scoliosis.  No thoracic vertebral fracture identified. CT ABDOMEN PELVIS FINDINGS Hepatobiliary: No perihepatic fluid. Negative noncontrast liver and gallbladder. Pancreas: Partially atrophied, otherwise negative. Spleen: No perisplenic fluid.  Negative noncontrast spleen. Adrenals/Urinary Tract: Adrenal glands are within normal limits. Kidneys are nonobstructed with occasional renal cortical scarring and punctate nephrolithiasis. No perinephric inflammation. Proximal ureters are decompressed. Unremarkable bladder. Stomach/Bowel: Decompressed but redundant large bowel throughout the pelvis. No dilated or inflamed large or small bowel. Cecum is on a lax mesentery located in the anterior abdomen. Appendix not delineated. Aside from hiatal hernia the stomach appears negative. Negative duodenum. No free air or free fluid. Vascular/Lymphatic: Extensive Aortoiliac calcified atherosclerosis. Normal caliber abdominal aorta. No lymphadenopathy. Reproductive: Surgically absent uterus. Diminutive or absent ovaries. Other: No pelvic free fluid. Musculoskeletal: Lumbar spine is reported separately earlier today. Sacrum, SI joints, pelvis and proximal femurs appear intact. IMPRESSION: 1. Multilevel acute on subacute to chronic right rib fractures - with acute fractures of the ribs 4, 7, 9 and 10. Possible subtle right 11th and 12th rib fractures better demonstrated on the earlier Lumbar Spine CT. 2. Right  lung atelectasis. No associated pneumothorax, pleural effusion, or pulmonary contusion. 3. No other acute traumatic injury identified in the absence of IV contrast. 4. Moderate size gastric hiatal hernia. Aortic Atherosclerosis (ICD10-I70.0). Electronically Signed   By: Odessa Fleming M.D.   On: 11/30/2020 04:11   CT CERVICAL SPINE WO CONTRAST  Result Date: 11/30/2020 CLINICAL DATA:  Recent fall with headaches and neck pain, initial encounter EXAM: CT HEAD WITHOUT CONTRAST CT CERVICAL SPINE WITHOUT CONTRAST TECHNIQUE: Multidetector CT imaging of the head and cervical spine was performed following the standard protocol without intravenous contrast. Multiplanar CT image reconstructions of the cervical spine were also generated. COMPARISON:  None. FINDINGS: CT HEAD FINDINGS Brain: No evidence of acute infarction, hemorrhage, hydrocephalus, extra-axial collection or mass lesion/mass effect. Chronic atrophic and ischemic changes are noted. Large sooner infarct is noted within the right thalamus. Vascular: No hyperdense vessel or unexpected calcification. Skull: Normal. Negative for fracture or focal lesion. Sinuses/Orbits: No acute finding. Other: None. CT CERVICAL SPINE FINDINGS Alignment: Mild anterolisthesis of C4 on C5 and C3 on C4 is seen. Skull base and vertebrae: 7 cervical segments are well visualized. Anterolisthesis is noted as described above secondary to facet hypertrophic changes. No acute fracture or acute facet abnormality is noted. Multilevel facet hypertrophic changes are noted. Osteophytic changes are noted at multiple levels. Soft tissues and spinal canal:  Surrounding soft tissue structures are within normal limits. Upper chest: Visualized lung apices are within normal limits. Other: None IMPRESSION: CT of the head: Chronic atrophic and ischemic changes without acute abnormality. CT of the cervical spine: Multilevel degenerative changes are noted without acute abnormality. Mild degenerative  anterolisthesis is seen as described. Electronically Signed   By: Alcide Clever M.D.   On: 11/30/2020 01:33   CT Lumbar Spine Wo Contrast  Result Date: 11/30/2020 CLINICAL DATA:  Initial evaluation for acute low back pain, trauma. EXAM: CT LUMBAR SPINE WITHOUT CONTRAST TECHNIQUE: Multidetector CT imaging of the lumbar spine was performed without intravenous contrast administration. Multiplanar CT image reconstructions were also generated. COMPARISON:  None available. FINDINGS: Segmentation: Standard. Lowest well-formed disc space labeled the L5-S1 level. Alignment: Moderate levoscoliosis with apex at L2. Alignment otherwise normal with preservation of the normal lumbar lordosis. No listhesis. Vertebrae: Vertebral body height maintained without acute or chronic fracture. Visualized sacrum and pelvis intact. SI joints symmetric and within normal limits. There are acute fractures of the right posterior ninth and tenth ribs (series 5, images 10, 25). Suspected additional subtle nondisplaced fractures of the right posterior eleventh and twelfth ribs as well (series 5, images 36, 51). Visualized left-sided ribs intact. No discrete or worrisome osseous lesions. Paraspinal and other soft tissues: Visualized paraspinous soft tissues demonstrate no acute finding. Moderate aorto bi-iliac atherosclerotic disease. Moderate hiatal hernia partially visualized. Multifocal cortical scarring noted about the kidneys with bilateral nonobstructive nephrolithiasis. Disc levels: L1-2: Degenerative intervertebral disc space narrowing with diffuse disc bulge and disc desiccation, asymmetric to the right. Right-sided reactive endplate change, with right greater than left facet hypertrophy. Mild narrowing of the right lateral recess. Central canal remains patent. Severe right L1 foraminal stenosis. L2-3: Degenerative intervertebral disc space narrowing with diffuse disc bulge and disc desiccation, asymmetric to the right. Prominent  right-sided reactive endplate change. Right worse than left facet arthrosis. Moderate right lateral recess narrowing. Central canal remains patent. Severe right L3 foraminal stenosis. L3-4: Disc bulge with disc desiccation. Reactive endplate spurring. Moderate bilateral facet arthrosis. Mild to moderate narrowing of the left lateral recess. Central canal remains patent. Mild bilateral foraminal narrowing. L4-5: Degenerative intervertebral disc space narrowing with disc desiccation and diffuse disc bulge, eccentric to the left. Prominent left-sided reactive endplate change. Moderate bilateral facet arthrosis. Resultant moderate canal with moderate left worse than right lateral recess stenosis. Severe left L4 foraminal stenosis. Right neural foramen remains patent. L5-S1: Degenerative intervertebral disc space narrowing with disc bulge and disc desiccation. Reactive endplate spurring. Moderate bilateral facet hypertrophy. Mild narrowing of the lateral recesses bilaterally. Moderate left L5 foraminal stenosis. Right neural foramina remains patent. IMPRESSION: 1. Acute fractures of the right posterior ninth and tenth ribs, with suspected additional subtle acute fractures of the right posterior eleventh and twelfth ribs. 2. No other acute traumatic injury within the lumbar spine. 3. Levoscoliosis with associated moderate to advanced multilevel degenerative spondylosis and facet arthrosis. Resultant moderate canal with left worse than right lateral recess stenosis at L4-5, with severe right L1, L2, L3, and left L4 foraminal stenosis. 4. Bilateral nonobstructive nephrolithiasis. 5. Moderate hiatal hernia. 6. Aortic Atherosclerosis (ICD10-I70.0). Electronically Signed   By: Rise Mu M.D.   On: 11/30/2020 02:25   DG Chest Port 1 View  Result Date: 11/30/2020 CLINICAL DATA:  Recent fall with right-sided chest pain, initial encounter EXAM: PORTABLE CHEST 1 VIEW COMPARISON:  09/24/2005 FINDINGS: Cardiac shadow  is enlarged. Thoracic aortic calcifications are noted with tortuosity. Rib fractures  are noted on the right involving the fourth, sixth, seventh and eighth ribs laterally. Some callus formation is noted suggesting that these are subacute to chronic. No pneumothorax is noted. Mild right basilar atelectasis is seen. Left lung remains clear. IMPRESSION: Right-sided rib fractures with some evidence of callus formation suggesting subacute rib fractures. Minimal right basilar atelectasis is noted. Electronically Signed   By: Alcide Clever M.D.   On: 11/30/2020 00:52    ROS 10 point review of systems is negative except as listed above in HPI.   Physical Exam Blood pressure (!) 170/68, pulse 82, temperature 98.2 F (36.8 C), temperature source Temporal, resp. rate (!) 24, height  (1.651 m), weight 82 kg, SpO2 92 %. Constitutional: well-developed, well-nourished HEENT: pupils equal, round, reactive to light, 2mm b/l, moist conjunctiva, external inspection of ears and nose normal, hearing diminished, bruising and swelling of the bridge of the nose Oropharynx: normal oropharyngeal mucosa, normal dentition Neck: no thyromegaly, trachea midline, no midline cervical tenderness to palpation Chest: breath sounds equal bilaterally, normal respiratory effort, no midline or lateral chest wall tenderness to palpation/deformity Abdomen: soft, NT, no bruising, no hepatosplenomegaly GU: normal female genitalia  Back: no wounds, no thoracic/lumbar spine tenderness to palpation, no thoracic/lumbar spine stepoffs Rectal: deferred Extremities: 2+ radial and pedal pulses bilaterally, motor and sensation intact to bilateral UE and LE, no peripheral edema, ulnar deviation of fingers b/l, skin tears of b/l forearms MSK: normal gait/station, no clubbing/cyanosis of fingers/toes, normal ROM of all four extremities Skin: warm, dry, no rashes Psych: normal memory, normal mood/affect    Assessment/Plan: 49F with rib frx.  Denies chest/rib pain. Sats 93-97% on RA while talking. Recommend CT max-face to eval for nasal bone frx and discussed management of this if present with the patient. Patient declines CT at this time. May f/u with ENT as o/p if desired. Recommend PT/OT eval prior to discharge, however patient declining this as well, stating she recently had a therapy session. Okay for discharge back to previous living facility.    Diamantina Monks, MD General and Trauma Surgery [facility]

## 2020-11-30 NOTE — ED Notes (Signed)
Patient transported to CT SCAN . 

## 2020-11-30 NOTE — ED Notes (Signed)
[facility] called  

## 2020-11-30 NOTE — ED Provider Notes (Signed)
[facility] Emergency Department Provider Note MRN:  [id number]  Arrival date & time: 11/30/20     Chief Complaint   Level2 Fall on Thinners   History of Present Illness   [name] is a 87 y.o. year-old female with a history of anticoagulation presenting to the ED with chief complaint of fall on thinners.  Location: Low back Duration: Years Onset: Gradual Timing: Constant Description: Ache Severity: Moderate Exacerbating/Alleviating Factors: None Associated Symptoms: Recent fall, head trauma Pertinent Negatives: No vomiting, no chest pain, no abdominal pain  Additional History: Suspected the patient's wheelchair tipped over while she was trying to transfer into bed.  Patient with complaints of low back pain, which is mostly chronic.  Also with bruising to bridge of the nose.  Denies chest pain or shortness of breath, no abdominal pain, no other complaints  Review of Systems  A complete 10 system review of systems was obtained and all systems are negative except as noted in the HPI and PMH.   Patient's Health History   Past medical history: Rheumatoid arthritis  Social history: Lives at assisted living   No family history on file.  Social History   Socioeconomic History   Marital status: Married    Spouse name: Not on file   Number of children: Not on file   Years of education: Not on file   Highest education level: Not on file  Occupational History   Not on file  Tobacco Use   Smoking status: Not on file   Smokeless tobacco: Not on file  Substance and Sexual Activity   Alcohol use: Not on file   Drug use: Not on file   Sexual activity: Not on file  Other Topics Concern   Not on file  Social History Narrative   Not on file   Social Determinants of Health   Financial Resource Strain: Not on file  Food Insecurity: Not on file  Transportation Needs: Not on file  Physical Activity: Not on file  Stress: Not on file  Social  Connections: Not on file  Intimate Partner Violence: Not on file     Physical Exam   Vitals:   11/30/20 0615 11/30/20 0630  BP: (!) 146/76 (!) 150/65  Pulse: 79 84  Resp: (!) 22 20  Temp:    SpO2: 90% 93%    CONSTITUTIONAL: Well-appearing, NAD NEURO:  Alert and oriented x 3, no focal deficits, hard of hearing EYES:  eyes equal and reactive ENT/NECK:  no LAD, no JVD CARDIO: Regular rate, well-perfused, normal S1 and S2 PULM:  CTAB no wheezing or rhonchi GI/GU:  normal bowel sounds, non-distended, non-tender MSK/SPINE:  No gross deformities, no edema SKIN: Skin tears to bilateral elbows, bruising and abrasion to bridge of the nose PSYCH:  Appropriate speech and behavior  *Additional and/or pertinent findings included in MDM below  Diagnostic and Interventional Summary    EKG Interpretation  Date/Time:  Thursday November 30 2020 00:44:57 EDT Ventricular Rate:  95 PR Interval:  194 QRS Duration: 86 QT Interval:  349 QTC Calculation: 439 R Axis:   47 Text Interpretation: Sinus or ectopic atrial rhythm Low voltage, precordial leads Borderline repolarization abnormality Confirmed by Kennis Carina [phone]) on 11/30/2020 6:30:17 AM       Labs Reviewed  CBC - Abnormal; Notable for the following components:      Result Value   WBC 11.8 (*)    Hemoglobin 11.3 (*)    MCHC 29.6 (*)  RDW 18.9 (*)    All other components within normal limits  COMPREHENSIVE METABOLIC PANEL - Abnormal; Notable for the following components:   CO2 21 (*)    Glucose, Bld 129 (*)    GFR, Estimated 59 (*)    All other components within normal limits  RESP PANEL BY RT-PCR (FLU A&B, COVID) [id number]  PROTIME-INR    CT Chest Wo Contrast  Final Result    CT ABDOMEN PELVIS WO CONTRAST  Final Result    CT HEAD WO CONTRAST ( )  Final Result    CT CERVICAL SPINE WO CONTRAST  Final Result    CT Lumbar Spine Wo Contrast  Final Result    DG Chest Port 1 View  Final Result      Medications -  No data to display   Procedures  /  Critical Care Procedures  ED Course and Medical Decision Making  I have reviewed the triage vital signs, the nursing notes, and pertinent available records from the EMR.  Listed above are laboratory and imaging tests that I personally ordered, reviewed, and interpreted and then considered in my medical decision making (see below for details).  Fall, anticoagulated, no evidence of head trauma, will obtain CT imaging to exclude intracranial bleeding, cervical spinal fracture.  She is endorsing low back pain as her most painful area right now, long history of chronic back pain, given the fall we will also CT lumbar spine to exclude compression fracture.  She is mildly hypoxic 88% on room air, denies use of oxygen at home, obtaining screening x-ray of the chest.  Suspect mechanical fall but patient does not remember how it happened, obtaining screening labs, EKG.     Work-up initially revealing acute fractures of the lower right ribs.  There is question of chronic fractures higher up on the right on x-ray.  Decision was made to obtain further CT imaging to ensure that these were chronic and rule out any other significant injuries.  CT imaging does demonstrate acute on chronic rib fractures higher up.  Total of 6 rib fractures, trauma surgery consulted for possible admission.  Evaluated by trauma surgery, patient's pain is well controlled, she is now requesting to be discharged back to her facility.  No further episodes of hypoxia, patient is appropriate for discharge.  Elmer Sow. Pilar Plate, MD [facility] Emergency Medicine [facility] [email or web address]  Final Clinical Impressions(s) / ED Diagnoses     ICD-10-CM   1. Closed fracture of multiple ribs of right side, initial encounter  S22.41XA       ED Discharge Orders     None        Discharge Instructions Discussed with and Provided to Patient:     Discharge Instructions      You  were evaluated in the Emergency Department and after careful evaluation, we did not find any emergent condition requiring admission or further testing in the hospital.  Your exam/testing today was overall reassuring.  Testing today has showed rib fractures from the fall.  It is important to take deep breaths throughout the day to keep your lungs healthy.  Recommend Tylenol or Motrin at home for pain.  Please return to the Emergency Department if you experience any worsening of your condition.  Thank you for allowing [place] to be a part of your care.         Sabas Sous, MD 11/30/20 [phone]

## 2020-12-04 DIAGNOSIS — S2249XA Multiple fractures of ribs, unspecified side, initial encounter for closed fracture: Secondary | ICD-10-CM | POA: Diagnosis not present

## 2020-12-04 DIAGNOSIS — W19XXXA Unspecified fall, initial encounter: Secondary | ICD-10-CM | POA: Diagnosis not present

## 2020-12-04 DIAGNOSIS — I10 Essential (primary) hypertension: Secondary | ICD-10-CM | POA: Diagnosis not present

## 2021-09-21 IMAGING — CT CT CHEST W/O CM
2 of 4 series · 14 of 46 positions shown, 16 images · non-contrast
Comparison: Portable chest radiograph 4432 hours. Cervical and
lumbar spine CT earlier today.

CLINICAL DATA: 89-year-old female status post fall. Pain down the
right side of her chest.

EXAM:
CT CHEST, ABDOMEN AND PELVIS WITHOUT CONTRAST
TECHNIQUE: Multidetector CT imaging of the chest, abdomen and pelvis was
performed following the standard protocol without IV contrast.

[Series 3: a/p w/o 5mm · axial · non-contrast · 0.83mm/px · z∈[+1076,+1601]mm · 11 of 117 slices shown, 13 images]
[im 6/117  soft-tissue]
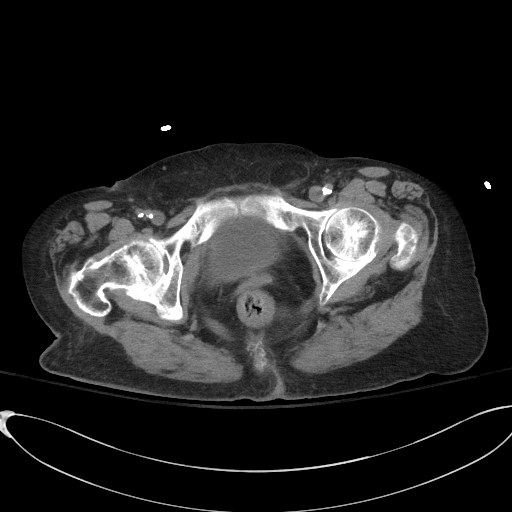
[im 6/117  bone]
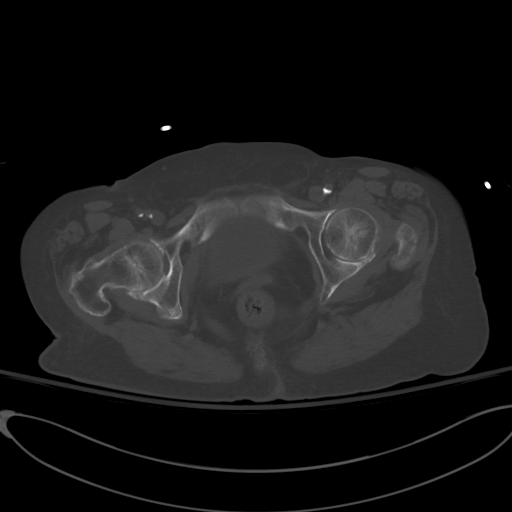
[im 18/117  soft-tissue]
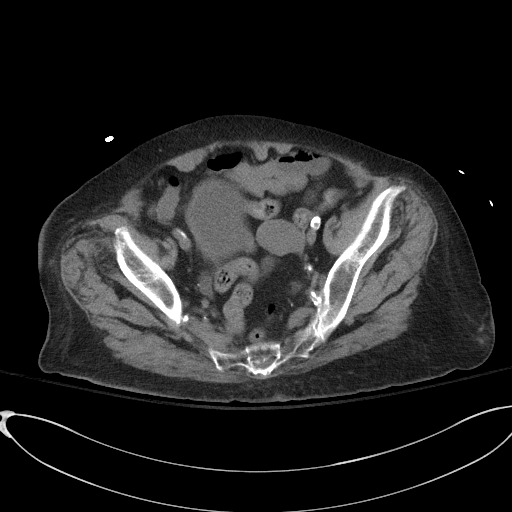
[im 30/117  soft-tissue]
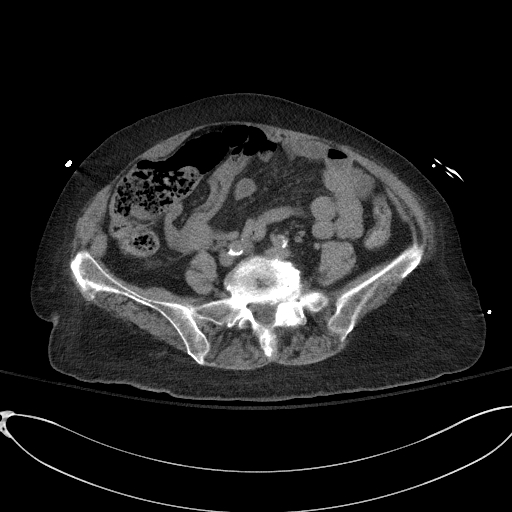
[im 41/117  soft-tissue]
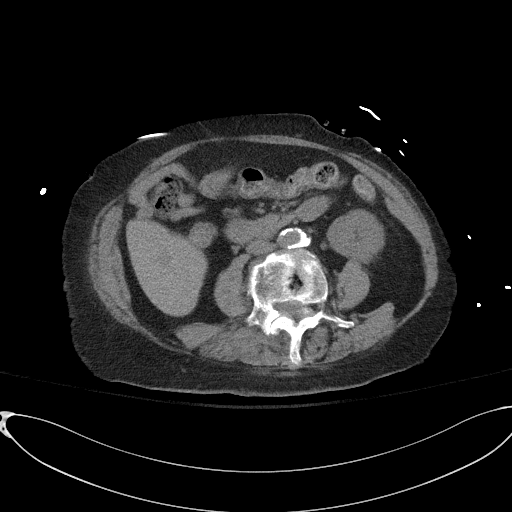
[im 47/117  soft-tissue]
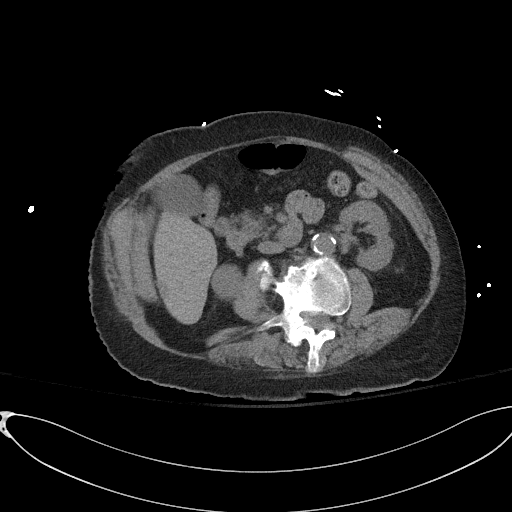
[im 59/117  soft-tissue]
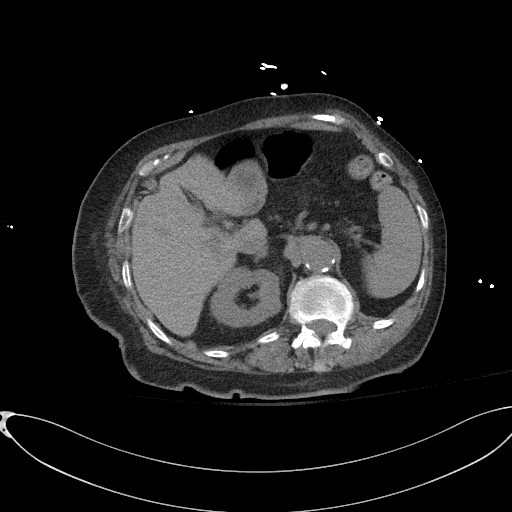
[im 70/117  soft-tissue]
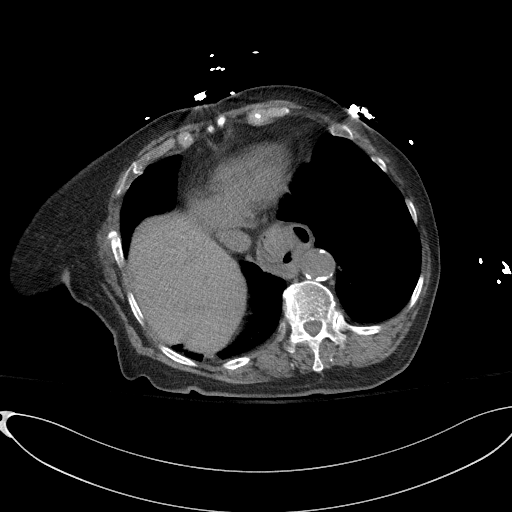
[im 76/117  soft-tissue]
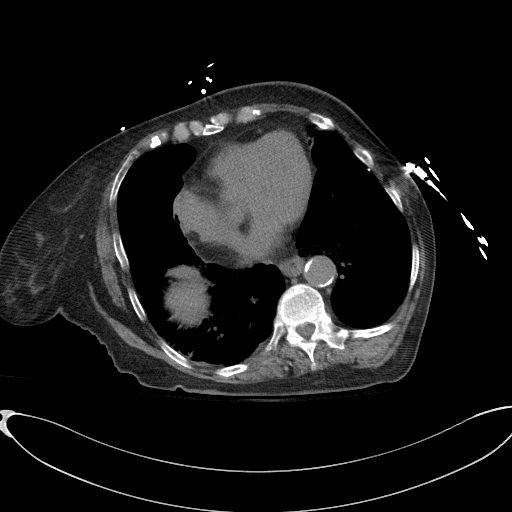
[im 88/117  soft-tissue]
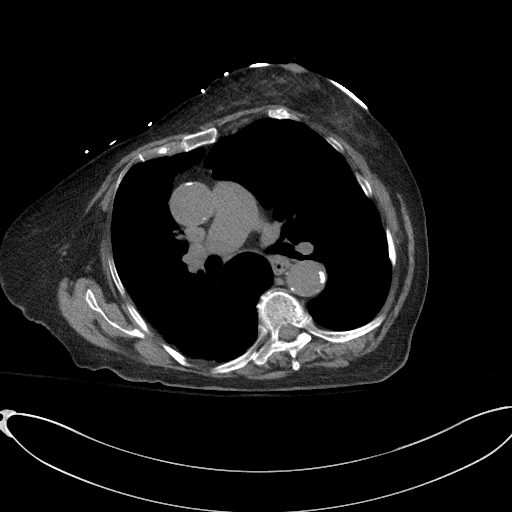
[im 88/117  bone]
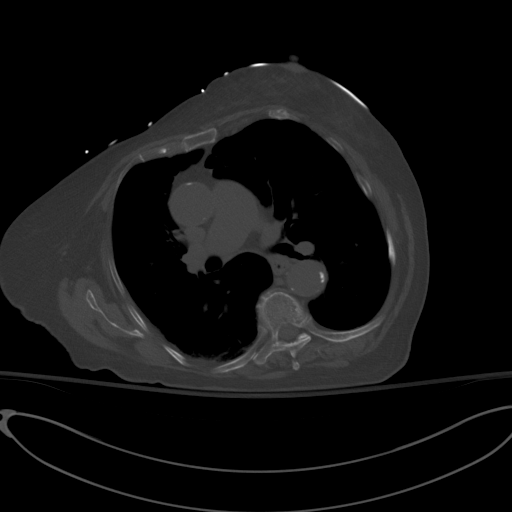
[im 99/117  soft-tissue]
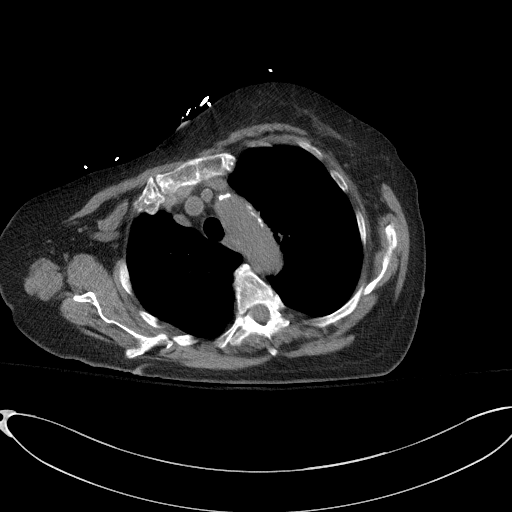
[im 111/117  soft-tissue]
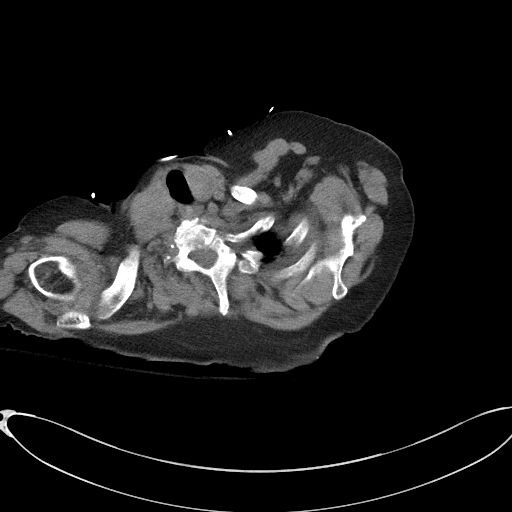

[Series 6: a/p w/o cor · coronal · non-contrast · 0.87mm/px · 3 of 151 slices shown]
[im 51/151  soft-tissue]
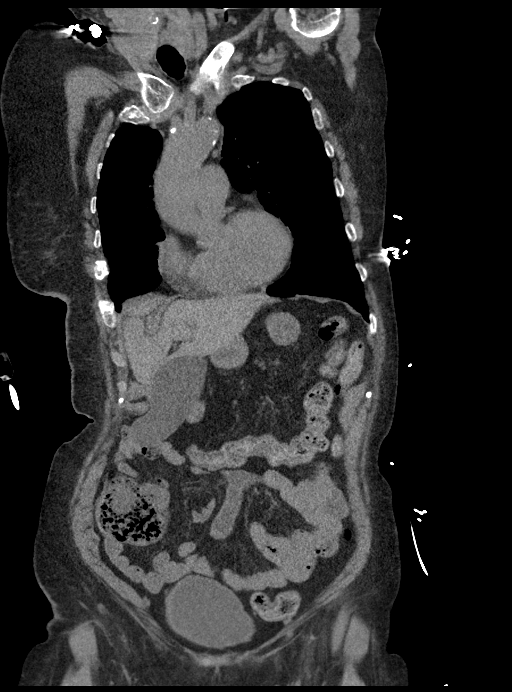
[im 67/151  soft-tissue]
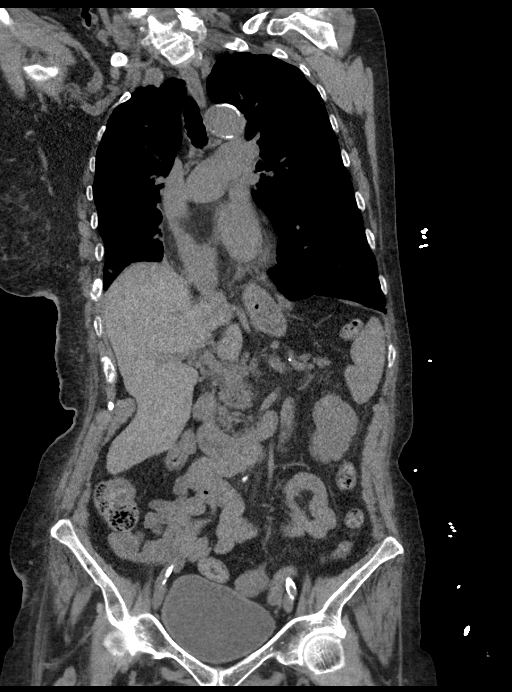
[im 84/151  soft-tissue]
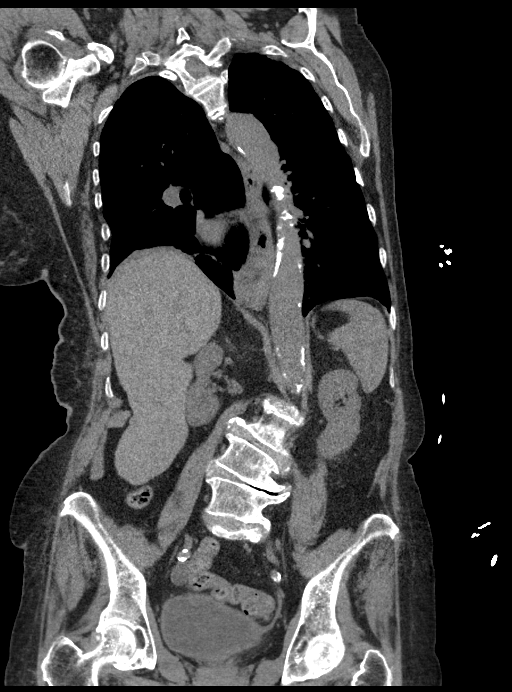

[14 of 46 positions shown; findings below may reference images not displayed]

FINDINGS: CT CHEST FINDINGS

Cardiovascular: Calcified aortic atherosclerosis. Mildly tortuous
thoracic aorta. Calcified coronary artery atherosclerosis on series
3, image 36. Cardiac size at the upper limits of normal. No
pericardial effusion.

Mediastinum/Nodes: No mediastinal hematoma or lymphadenopathy is
evident in the absence of IV contrast. There is a moderate size
gastric hiatal hernia.

Lungs/Pleura: Major airways are patent. There is atelectasis in the
right lower lobe. No pneumothorax. No pleural effusion. No pulmonary
contusion.

Musculoskeletal: Visible shoulder osseous structures appear intact.
Sternum appears intact. No acute left-sided rib fracture identified.

There is a chronic appearing lateral right 9th rib fracture on
series 5, image 63. And nearby subacute appearing fractures of the
right lateral 8th, anterior 7th ribs (image 53), and anterior 6th
rib (image 46). But superimposed acute fractures of the posterior
9th rib (series 5, image 50) lateral 7th rib (same image) lateral
4th rib (image 28) and posterior 10th rib (image 56). Possible
subtle nondisplaced posterior 11th and 12th right rib fractures
better demonstrated on the lumbar spine CT.

Mild thoracic scoliosis.  No thoracic vertebral fracture identified.

CT ABDOMEN PELVIS FINDINGS

Hepatobiliary: No perihepatic fluid. Negative noncontrast liver and
gallbladder.

Pancreas: Partially atrophied, otherwise negative.

Spleen: No perisplenic fluid.  Negative noncontrast spleen.

Adrenals/Urinary Tract: Adrenal glands are within normal limits.
Kidneys are nonobstructed with occasional renal cortical scarring
and punctate nephrolithiasis. No perinephric inflammation. Proximal
ureters are decompressed. Unremarkable bladder.

Stomach/Bowel: Decompressed but redundant large bowel throughout the
pelvis. No dilated or inflamed large or small bowel. Cecum is on a
lax mesentery located in the anterior abdomen. Appendix not
delineated. Aside from hiatal hernia the stomach appears negative.
Negative duodenum. No free air or free fluid.

Vascular/Lymphatic: Extensive Aortoiliac calcified atherosclerosis.
Normal caliber abdominal aorta. No lymphadenopathy.

Reproductive: Surgically absent uterus. Diminutive or absent
ovaries.

Other: No pelvic free fluid.

Musculoskeletal: Lumbar spine is reported separately earlier today.
Sacrum, SI joints, pelvis and proximal femurs appear intact.
IMPRESSION: 1. Multilevel acute on subacute to chronic right rib fractures -
with acute fractures of the ribs 4, 7, 9 and 10. Possible subtle
right 11th and 12th rib fractures better demonstrated on the earlier
Lumbar Spine CT.

2. Right lung atelectasis. No associated pneumothorax, pleural
effusion, or pulmonary contusion.

3. No other acute traumatic injury identified in the absence of IV
contrast.

4. Moderate size gastric hiatal hernia. Aortic Atherosclerosis
([id number]).

## 2021-09-21 IMAGING — CT CT ABD-PELV W/O CM
2 series · 15 of 42 positions shown, 17 images · non-contrast
Comparison: Portable chest radiograph 4432 hours. Cervical and
lumbar spine CT earlier today.

CLINICAL DATA: 89-year-old female status post fall. Pain down the
right side of her chest.

EXAM:
CT CHEST, ABDOMEN AND PELVIS WITHOUT CONTRAST
TECHNIQUE: Multidetector CT imaging of the chest, abdomen and pelvis was
performed following the standard protocol without IV contrast.

[Series 5: lung · axial · 0.83mm/px · z∈[+1346,+1616]mm · 12 of 63 slices shown, 14 images]
[im 5/63  soft-tissue]
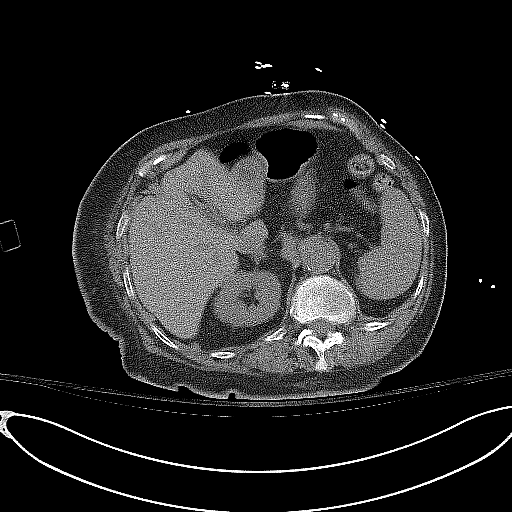
[im 5/63  bone]
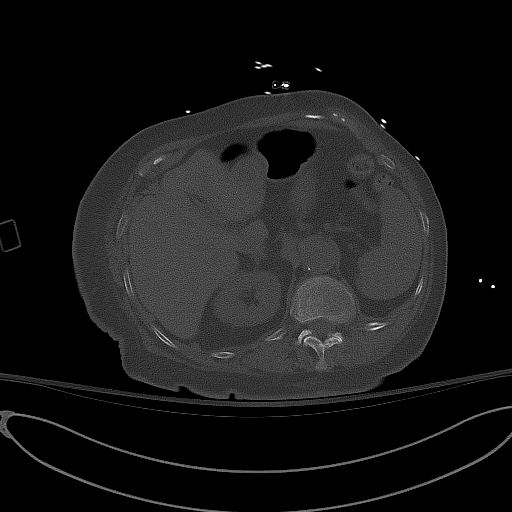
[im 9/63  soft-tissue]
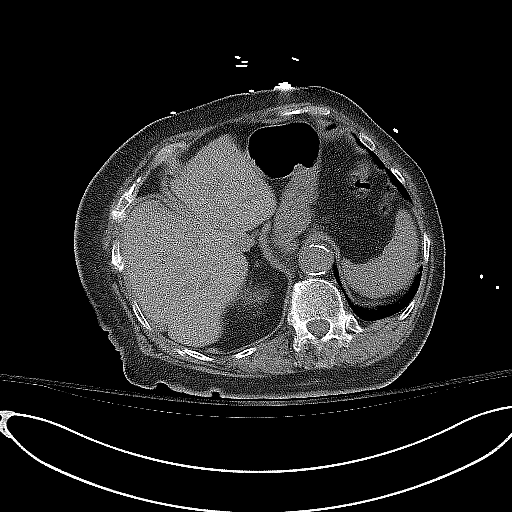
[im 15/63  soft-tissue]
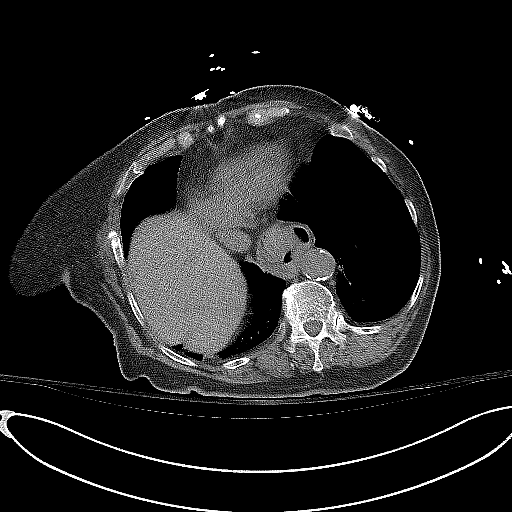
[im 19/63  soft-tissue]
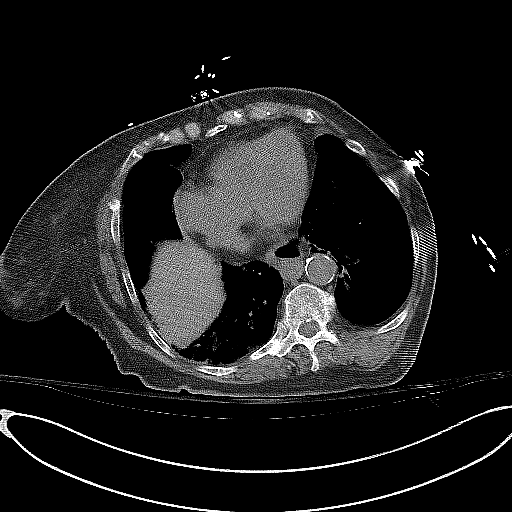
[im 25/63  soft-tissue]
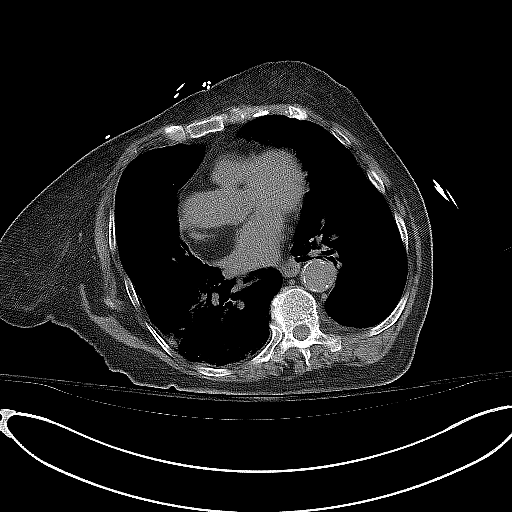
[im 29/63  soft-tissue]
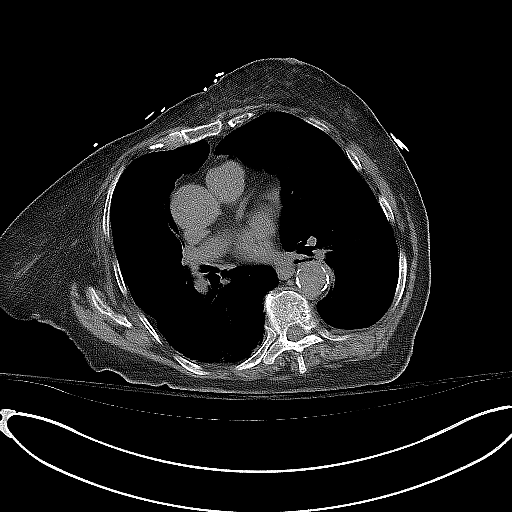
[im 35/63  soft-tissue]
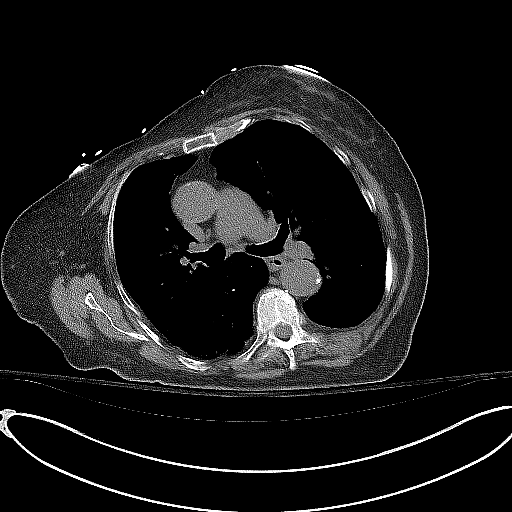
[im 39/63  soft-tissue]
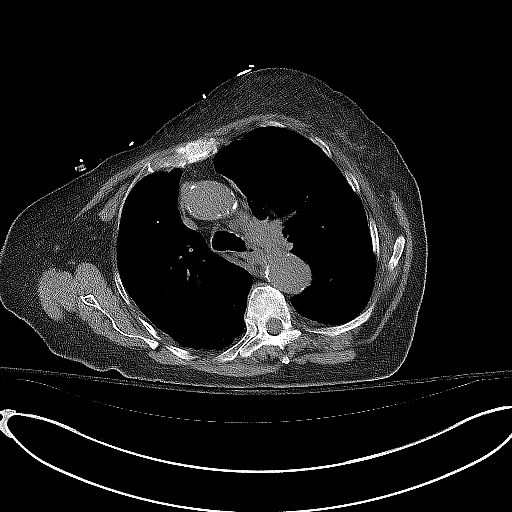
[im 45/63  soft-tissue]
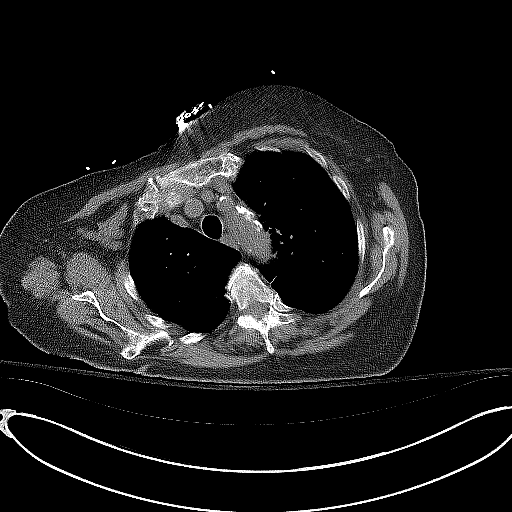
[im 45/63  bone]
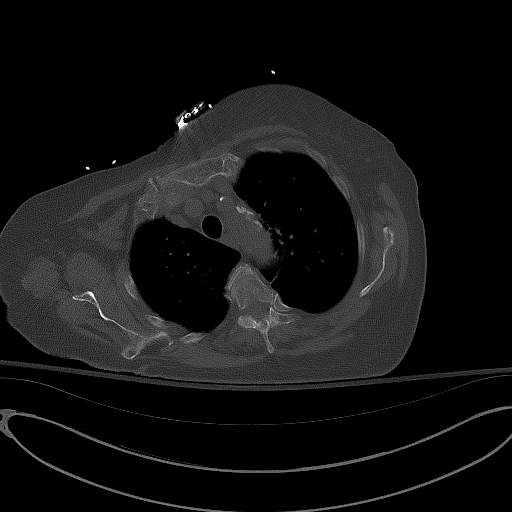
[im 49/63  soft-tissue]
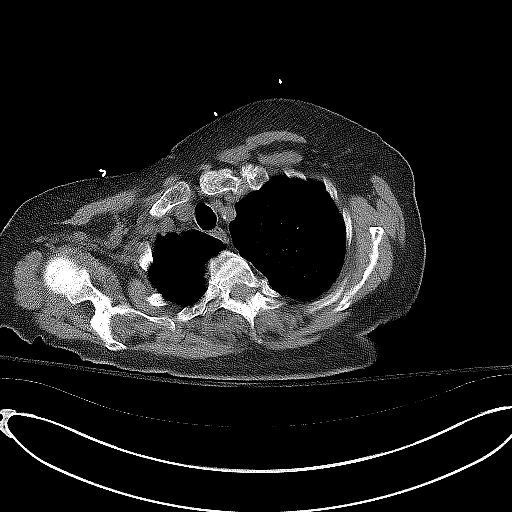
[im 55/63  soft-tissue]
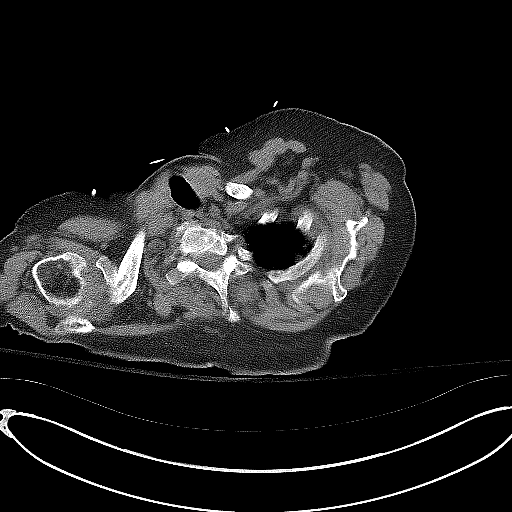
[im 59/63  soft-tissue]
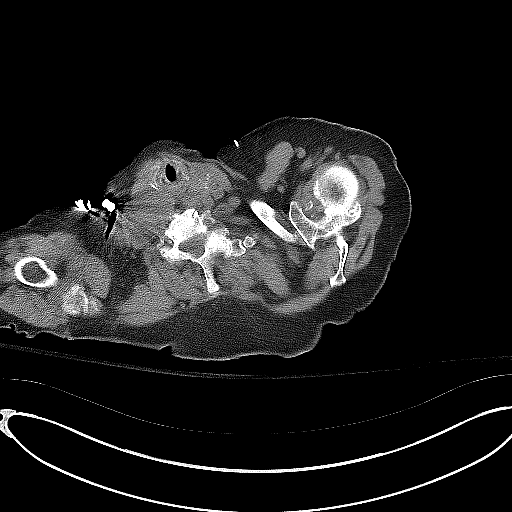

[Series 6: a/p w/o cor · coronal · non-contrast · 0.87mm/px · 3 of 151 slices shown]
[im 51/151  soft-tissue]
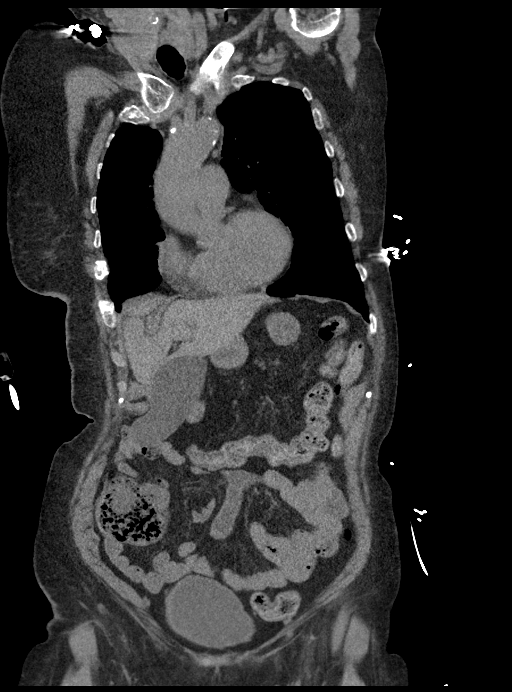
[im 67/151  soft-tissue]
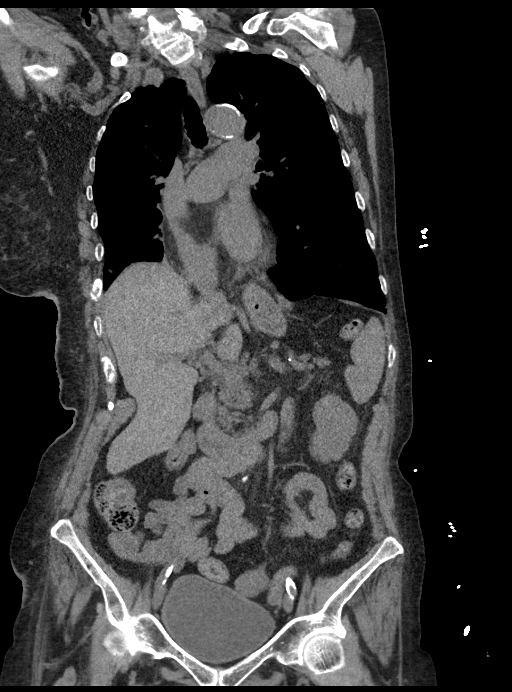
[im 84/151  soft-tissue]
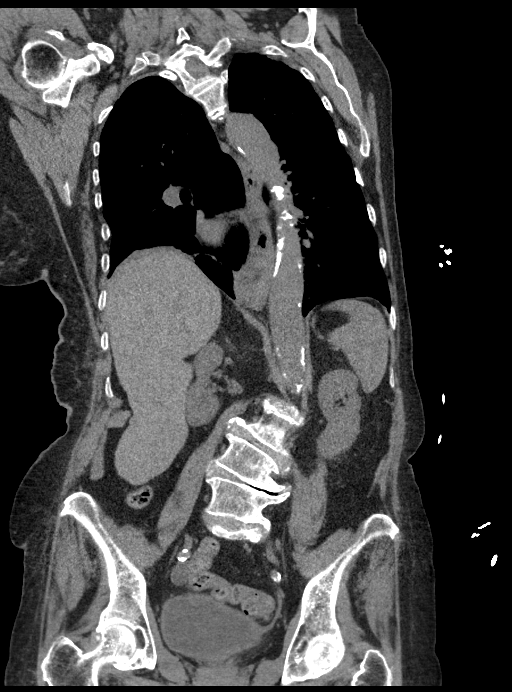

[15 of 42 positions shown; findings below may reference images not displayed]

FINDINGS: CT CHEST FINDINGS

Cardiovascular: Calcified aortic atherosclerosis. Mildly tortuous
thoracic aorta. Calcified coronary artery atherosclerosis on series
3, image 36. Cardiac size at the upper limits of normal. No
pericardial effusion.

Mediastinum/Nodes: No mediastinal hematoma or lymphadenopathy is
evident in the absence of IV contrast. There is a moderate size
gastric hiatal hernia.

Lungs/Pleura: Major airways are patent. There is atelectasis in the
right lower lobe. No pneumothorax. No pleural effusion. No pulmonary
contusion.

Musculoskeletal: Visible shoulder osseous structures appear intact.
Sternum appears intact. No acute left-sided rib fracture identified.

There is a chronic appearing lateral right 9th rib fracture on
series 5, image 63. And nearby subacute appearing fractures of the
right lateral 8th, anterior 7th ribs (image 53), and anterior 6th
rib (image 46). But superimposed acute fractures of the posterior
9th rib (series 5, image 50) lateral 7th rib (same image) lateral
4th rib (image 28) and posterior 10th rib (image 56). Possible
subtle nondisplaced posterior 11th and 12th right rib fractures
better demonstrated on the lumbar spine CT.

Mild thoracic scoliosis.  No thoracic vertebral fracture identified.

CT ABDOMEN PELVIS FINDINGS

Hepatobiliary: No perihepatic fluid. Negative noncontrast liver and
gallbladder.

Pancreas: Partially atrophied, otherwise negative.

Spleen: No perisplenic fluid.  Negative noncontrast spleen.

Adrenals/Urinary Tract: Adrenal glands are within normal limits.
Kidneys are nonobstructed with occasional renal cortical scarring
and punctate nephrolithiasis. No perinephric inflammation. Proximal
ureters are decompressed. Unremarkable bladder.

Stomach/Bowel: Decompressed but redundant large bowel throughout the
pelvis. No dilated or inflamed large or small bowel. Cecum is on a
lax mesentery located in the anterior abdomen. Appendix not
delineated. Aside from hiatal hernia the stomach appears negative.
Negative duodenum. No free air or free fluid.

Vascular/Lymphatic: Extensive Aortoiliac calcified atherosclerosis.
Normal caliber abdominal aorta. No lymphadenopathy.

Reproductive: Surgically absent uterus. Diminutive or absent
ovaries.

Other: No pelvic free fluid.

Musculoskeletal: Lumbar spine is reported separately earlier today.
Sacrum, SI joints, pelvis and proximal femurs appear intact.
IMPRESSION: 1. Multilevel acute on subacute to chronic right rib fractures -
with acute fractures of the ribs 4, 7, 9 and 10. Possible subtle
right 11th and 12th rib fractures better demonstrated on the earlier
Lumbar Spine CT.

2. Right lung atelectasis. No associated pneumothorax, pleural
effusion, or pulmonary contusion.

3. No other acute traumatic injury identified in the absence of IV
contrast.

4. Moderate size gastric hiatal hernia. Aortic Atherosclerosis
([id number]).

## 2022-04-09 DIAGNOSIS — I361 Nonrheumatic tricuspid (valve) insufficiency: Secondary | ICD-10-CM | POA: Diagnosis not present

## 2022-04-09 DIAGNOSIS — J90 Pleural effusion, not elsewhere classified: Secondary | ICD-10-CM | POA: Diagnosis not present

## 2022-04-09 DIAGNOSIS — I340 Nonrheumatic mitral (valve) insufficiency: Secondary | ICD-10-CM | POA: Diagnosis not present

## 2022-07-12 DIAGNOSIS — F331 Major depressive disorder, recurrent, moderate: Secondary | ICD-10-CM | POA: Diagnosis not present

## 2022-07-12 DIAGNOSIS — E119 Type 2 diabetes mellitus without complications: Secondary | ICD-10-CM | POA: Diagnosis not present

## 2022-07-12 DIAGNOSIS — M069 Rheumatoid arthritis, unspecified: Secondary | ICD-10-CM | POA: Diagnosis not present

## 2022-07-12 DIAGNOSIS — G8929 Other chronic pain: Secondary | ICD-10-CM | POA: Diagnosis not present

## 2022-07-15 DIAGNOSIS — R300 Dysuria: Secondary | ICD-10-CM | POA: Diagnosis not present

## 2022-07-21 ENCOUNTER — Encounter (HOSPITAL_COMMUNITY): Payer: Self-pay | Admitting: Pharmacy Technician

## 2022-07-21 ENCOUNTER — Emergency Department (HOSPITAL_COMMUNITY): Payer: Medicare Other

## 2022-07-21 ENCOUNTER — Inpatient Hospital Stay (HOSPITAL_COMMUNITY): Payer: Medicare Other

## 2022-07-21 ENCOUNTER — Other Ambulatory Visit: Payer: Self-pay

## 2022-07-21 ENCOUNTER — Inpatient Hospital Stay (HOSPITAL_COMMUNITY)
Admission: EM | Admit: 2022-07-21 | Discharge: 2022-07-25 | DRG: 193 | Disposition: A | Discharge status: Skilled Nursing Facility | Payer: Medicare Other | Source: Skilled Nursing Facility | Attending: Internal Medicine | Admitting: Internal Medicine

## 2022-07-21 DIAGNOSIS — E46 Unspecified protein-calorie malnutrition: Secondary | ICD-10-CM | POA: Insufficient documentation

## 2022-07-21 DIAGNOSIS — J90 Pleural effusion, not elsewhere classified: Secondary | ICD-10-CM

## 2022-07-21 DIAGNOSIS — E8809 Other disorders of plasma-protein metabolism, not elsewhere classified: Secondary | ICD-10-CM | POA: Diagnosis not present

## 2022-07-21 DIAGNOSIS — I480 Paroxysmal atrial fibrillation: Secondary | ICD-10-CM | POA: Diagnosis present

## 2022-07-21 DIAGNOSIS — S52602A Unspecified fracture of lower end of left ulna, initial encounter for closed fracture: Secondary | ICD-10-CM | POA: Diagnosis not present

## 2022-07-21 DIAGNOSIS — E860 Dehydration: Secondary | ICD-10-CM | POA: Insufficient documentation

## 2022-07-21 DIAGNOSIS — R079 Chest pain, unspecified: Secondary | ICD-10-CM | POA: Diagnosis not present

## 2022-07-21 DIAGNOSIS — J690 Pneumonitis due to inhalation of food and vomit: Secondary | ICD-10-CM

## 2022-07-21 DIAGNOSIS — R0789 Other chest pain: Secondary | ICD-10-CM | POA: Diagnosis not present

## 2022-07-21 DIAGNOSIS — R2681 Unsteadiness on feet: Secondary | ICD-10-CM | POA: Diagnosis not present

## 2022-07-21 DIAGNOSIS — M25561 Pain in right knee: Secondary | ICD-10-CM

## 2022-07-21 DIAGNOSIS — J9601 Acute respiratory failure with hypoxia: Secondary | ICD-10-CM | POA: Diagnosis not present

## 2022-07-21 DIAGNOSIS — M7989 Other specified soft tissue disorders: Secondary | ICD-10-CM | POA: Diagnosis not present

## 2022-07-21 DIAGNOSIS — E441 Mild protein-calorie malnutrition: Secondary | ICD-10-CM | POA: Diagnosis not present

## 2022-07-21 DIAGNOSIS — M6281 Muscle weakness (generalized): Secondary | ICD-10-CM | POA: Diagnosis not present

## 2022-07-21 DIAGNOSIS — J189 Pneumonia, unspecified organism: Secondary | ICD-10-CM | POA: Diagnosis not present

## 2022-07-21 DIAGNOSIS — W19XXXA Unspecified fall, initial encounter: Secondary | ICD-10-CM | POA: Diagnosis present

## 2022-07-21 DIAGNOSIS — R109 Unspecified abdominal pain: Secondary | ICD-10-CM | POA: Diagnosis not present

## 2022-07-21 DIAGNOSIS — Z880 Allergy status to penicillin: Secondary | ICD-10-CM

## 2022-07-21 DIAGNOSIS — G9389 Other specified disorders of brain: Secondary | ICD-10-CM

## 2022-07-21 DIAGNOSIS — R519 Headache, unspecified: Secondary | ICD-10-CM | POA: Diagnosis not present

## 2022-07-21 DIAGNOSIS — Z6820 Body mass index (BMI) 20.0-20.9, adult: Secondary | ICD-10-CM

## 2022-07-21 DIAGNOSIS — M542 Cervicalgia: Secondary | ICD-10-CM | POA: Diagnosis not present

## 2022-07-21 DIAGNOSIS — N179 Acute kidney failure, unspecified: Secondary | ICD-10-CM | POA: Insufficient documentation

## 2022-07-21 DIAGNOSIS — Z8679 Personal history of other diseases of the circulatory system: Secondary | ICD-10-CM

## 2022-07-21 DIAGNOSIS — S52202A Unspecified fracture of shaft of left ulna, initial encounter for closed fracture: Secondary | ICD-10-CM | POA: Diagnosis not present

## 2022-07-21 DIAGNOSIS — Z66 Do not resuscitate: Secondary | ICD-10-CM | POA: Diagnosis present

## 2022-07-21 DIAGNOSIS — M069 Rheumatoid arthritis, unspecified: Secondary | ICD-10-CM | POA: Diagnosis present

## 2022-07-21 DIAGNOSIS — S2242XD Multiple fractures of ribs, left side, subsequent encounter for fracture with routine healing: Secondary | ICD-10-CM | POA: Diagnosis not present

## 2022-07-21 DIAGNOSIS — Z8616 Personal history of COVID-19: Secondary | ICD-10-CM

## 2022-07-21 DIAGNOSIS — I10 Essential (primary) hypertension: Secondary | ICD-10-CM | POA: Diagnosis present

## 2022-07-21 DIAGNOSIS — S2232XA Fracture of one rib, left side, initial encounter for closed fracture: Secondary | ICD-10-CM | POA: Insufficient documentation

## 2022-07-21 DIAGNOSIS — M79602 Pain in left arm: Secondary | ICD-10-CM | POA: Diagnosis not present

## 2022-07-21 DIAGNOSIS — Z7952 Long term (current) use of systemic steroids: Secondary | ICD-10-CM

## 2022-07-21 DIAGNOSIS — S52202D Unspecified fracture of shaft of left ulna, subsequent encounter for closed fracture with routine healing: Secondary | ICD-10-CM | POA: Diagnosis not present

## 2022-07-21 DIAGNOSIS — Z743 Need for continuous supervision: Secondary | ICD-10-CM | POA: Diagnosis not present

## 2022-07-21 DIAGNOSIS — D649 Anemia, unspecified: Secondary | ICD-10-CM | POA: Diagnosis present

## 2022-07-21 DIAGNOSIS — R2689 Other abnormalities of gait and mobility: Secondary | ICD-10-CM | POA: Diagnosis not present

## 2022-07-21 DIAGNOSIS — Z515 Encounter for palliative care: Secondary | ICD-10-CM | POA: Diagnosis not present

## 2022-07-21 DIAGNOSIS — M71561 Other bursitis, not elsewhere classified, right knee: Secondary | ICD-10-CM | POA: Diagnosis present

## 2022-07-21 DIAGNOSIS — S2242XA Multiple fractures of ribs, left side, initial encounter for closed fracture: Secondary | ICD-10-CM | POA: Diagnosis not present

## 2022-07-21 DIAGNOSIS — K8020 Calculus of gallbladder without cholecystitis without obstruction: Secondary | ICD-10-CM | POA: Diagnosis present

## 2022-07-21 DIAGNOSIS — G250 Essential tremor: Secondary | ICD-10-CM | POA: Diagnosis present

## 2022-07-21 DIAGNOSIS — Z886 Allergy status to analgesic agent status: Secondary | ICD-10-CM

## 2022-07-21 DIAGNOSIS — F32A Depression, unspecified: Secondary | ICD-10-CM | POA: Insufficient documentation

## 2022-07-21 DIAGNOSIS — Z79899 Other long term (current) drug therapy: Secondary | ICD-10-CM

## 2022-07-21 DIAGNOSIS — R059 Cough, unspecified: Secondary | ICD-10-CM | POA: Diagnosis not present

## 2022-07-21 DIAGNOSIS — Z7901 Long term (current) use of anticoagulants: Secondary | ICD-10-CM

## 2022-07-21 DIAGNOSIS — S52002A Unspecified fracture of upper end of left ulna, initial encounter for closed fracture: Secondary | ICD-10-CM | POA: Diagnosis not present

## 2022-07-21 DIAGNOSIS — R531 Weakness: Secondary | ICD-10-CM | POA: Diagnosis not present

## 2022-07-21 DIAGNOSIS — M25461 Effusion, right knee: Secondary | ICD-10-CM | POA: Diagnosis present

## 2022-07-21 DIAGNOSIS — F419 Anxiety disorder, unspecified: Secondary | ICD-10-CM | POA: Diagnosis present

## 2022-07-21 DIAGNOSIS — Z7189 Other specified counseling: Secondary | ICD-10-CM | POA: Diagnosis not present

## 2022-07-21 DIAGNOSIS — Z888 Allergy status to other drugs, medicaments and biological substances status: Secondary | ICD-10-CM

## 2022-07-21 DIAGNOSIS — M8580 Other specified disorders of bone density and structure, unspecified site: Secondary | ICD-10-CM | POA: Diagnosis present

## 2022-07-21 DIAGNOSIS — R1312 Dysphagia, oropharyngeal phase: Secondary | ICD-10-CM | POA: Diagnosis not present

## 2022-07-21 DIAGNOSIS — I482 Chronic atrial fibrillation, unspecified: Secondary | ICD-10-CM | POA: Diagnosis present

## 2022-07-21 DIAGNOSIS — K449 Diaphragmatic hernia without obstruction or gangrene: Secondary | ICD-10-CM | POA: Diagnosis present

## 2022-07-21 LAB — HEPATIC FUNCTION PANEL
ALT: 11 U/L (ref 0–44)
AST: 19 U/L (ref 15–41)
Albumin: 3.3 g/dL — ABNORMAL LOW (ref 3.5–5.0)
Alkaline Phosphatase: 105 U/L (ref 38–126)
Bilirubin, Direct: 0.2 mg/dL (ref 0.0–0.2)
Indirect Bilirubin: 0.4 mg/dL (ref 0.3–0.9)
Total Bilirubin: 0.6 mg/dL (ref 0.3–1.2)
Total Protein: 7.1 g/dL (ref 6.5–8.1)

## 2022-07-21 LAB — BASIC METABOLIC PANEL
Anion gap: 11 (ref 5–15)
BUN: 21 mg/dL (ref 8–23)
CO2: 25 mmol/L (ref 22–32)
Calcium: 8.8 mg/dL — ABNORMAL LOW (ref 8.9–10.3)
Chloride: 98 mmol/L (ref 98–111)
Creatinine, Ser: 1.39 mg/dL — ABNORMAL HIGH (ref 0.44–1.00)
GFR, Estimated: 36 mL/min — ABNORMAL LOW (ref >60)
Glucose, Bld: 157 mg/dL — ABNORMAL HIGH (ref 70–99)
Potassium: 3.5 mmol/L (ref 3.5–5.1)
Sodium: 134 mmol/L — ABNORMAL LOW (ref 135–145)

## 2022-07-21 LAB — TROPONIN I (HIGH SENSITIVITY)
Troponin I (High Sensitivity): 11 ng/L (ref <18)
Troponin I (High Sensitivity): 10 ng/L (ref <18)

## 2022-07-21 LAB — CBC
HCT: 29.3 % — ABNORMAL LOW (ref 36.0–46.0)
Hemoglobin: 8.8 g/dL — ABNORMAL LOW (ref 12.0–15.0)
MCH: 26.3 pg (ref 26.0–34.0)
MCHC: 30 g/dL (ref 30.0–36.0)
MCV: 87.7 fL (ref 80.0–100.0)
Platelets: 202 10*3/uL (ref 150–400)
RBC: 3.34 MIL/uL — ABNORMAL LOW (ref 3.87–5.11)
RDW: 19.6 % — ABNORMAL HIGH (ref 11.5–15.5)
WBC: 11.5 10*3/uL — ABNORMAL HIGH (ref 4.0–10.5)
nRBC: 0 % (ref 0.0–0.2)

## 2022-07-21 MED ORDER — ALBUTEROL SULFATE HFA 108 (90 BASE) MCG/ACT IN AERS
2.0000 | INHALATION_SPRAY | Freq: Once | RESPIRATORY_TRACT | Status: AC
  Administered 2022-07-21: 2 via RESPIRATORY_TRACT
  Filled 2022-07-21: qty 6.7

## 2022-07-21 MED ORDER — DM-GUAIFENESIN ER 30-600 MG PO TB12
1.0000 | ORAL_TABLET | Freq: Two times a day (BID) | ORAL | Status: DC
  Administered 2022-07-21 – 2022-07-25 (×8): 1 via ORAL
  Filled 2022-07-21 (×8): qty 1

## 2022-07-21 MED ORDER — ONDANSETRON HCL 4 MG PO TABS: 4.0000 mg | ORAL_TABLET | Freq: Four times a day (QID) | ORAL | Status: DC | PRN

## 2022-07-21 MED ORDER — SODIUM CHLORIDE 0.9 % IV SOLN
500.0000 mg | Freq: Once | INTRAVENOUS | Status: AC
  Administered 2022-07-21: 500 mg via INTRAVENOUS
  Filled 2022-07-21: qty 5

## 2022-07-21 MED ORDER — ENOXAPARIN SODIUM 60 MG/0.6ML IJ SOSY
60.0000 mg | PREFILLED_SYRINGE | INTRAMUSCULAR | Status: DC
  Administered 2022-07-21: 60 mg via SUBCUTANEOUS
  Filled 2022-07-21: qty 0.6

## 2022-07-21 MED ORDER — BENZONATATE 100 MG PO CAPS
100.0000 mg | ORAL_CAPSULE | Freq: Once | ORAL | Status: AC
  Administered 2022-07-21: 100 mg via ORAL
  Filled 2022-07-21: qty 1

## 2022-07-21 MED ORDER — OXYCODONE-ACETAMINOPHEN 5-325 MG PO TABS
1.0000 | ORAL_TABLET | ORAL | Status: DC | PRN
  Administered 2022-07-21 – 2022-07-24 (×6): 1 via ORAL
  Filled 2022-07-21 (×6): qty 1

## 2022-07-21 MED ORDER — OXYCODONE-ACETAMINOPHEN 5-325 MG PO TABS
1.0000 | ORAL_TABLET | Freq: Once | ORAL | Status: AC
  Administered 2022-07-21: 1 via ORAL
  Filled 2022-07-21: qty 1

## 2022-07-21 MED ORDER — IPRATROPIUM-ALBUTEROL 0.5-2.5 (3) MG/3ML IN SOLN: 3.0000 mL | RESPIRATORY_TRACT | Status: DC | PRN

## 2022-07-21 MED ORDER — ACETAMINOPHEN 650 MG RE SUPP: 650.0000 mg | Freq: Four times a day (QID) | RECTAL | Status: DC | PRN

## 2022-07-21 MED ORDER — SODIUM CHLORIDE 0.9 % IV BOLUS (SEPSIS)
500.0000 mL | Freq: Once | INTRAVENOUS | Status: AC
  Administered 2022-07-21: 500 mL via INTRAVENOUS

## 2022-07-21 MED ORDER — SODIUM CHLORIDE 0.9 % IV SOLN
1.0000 g | Freq: Once | INTRAVENOUS | Status: AC
  Administered 2022-07-21: 1 g via INTRAVENOUS
  Filled 2022-07-21: qty 10

## 2022-07-21 MED ORDER — SODIUM CHLORIDE 0.9 % IV SOLN
2.0000 g | INTRAVENOUS | Status: DC
  Administered 2022-07-22 – 2022-07-24 (×3): 2 g via INTRAVENOUS
  Filled 2022-07-21 (×3): qty 20

## 2022-07-21 MED ORDER — VENLAFAXINE HCL ER 75 MG PO CP24
75.0000 mg | ORAL_CAPSULE | Freq: Every day | ORAL | Status: DC
  Administered 2022-07-21 – 2022-07-25 (×5): 75 mg via ORAL
  Filled 2022-07-21 (×5): qty 1

## 2022-07-21 MED ORDER — ACETAMINOPHEN 325 MG PO TABS: 650.0000 mg | ORAL_TABLET | Freq: Four times a day (QID) | ORAL | Status: DC | PRN

## 2022-07-21 MED ORDER — LIDOCAINE 5 % EX PTCH
1.0000 | MEDICATED_PATCH | CUTANEOUS | Status: DC
  Administered 2022-07-21 – 2022-07-22 (×2): 1 via TRANSDERMAL
  Filled 2022-07-21 (×3): qty 1

## 2022-07-21 MED ORDER — GLUCERNA SHAKE PO LIQD
237.0000 mL | Freq: Three times a day (TID) | ORAL | Status: DC
  Administered 2022-07-22 – 2022-07-24 (×7): 237 mL via ORAL

## 2022-07-21 MED ORDER — SODIUM CHLORIDE 0.9 % IV SOLN: INTRAVENOUS | Status: AC

## 2022-07-21 MED ORDER — SODIUM CHLORIDE 0.9 % IV SOLN
500.0000 mg | INTRAVENOUS | Status: DC
  Administered 2022-07-22 – 2022-07-24 (×3): 500 mg via INTRAVENOUS
  Filled 2022-07-21 (×4): qty 5

## 2022-07-21 MED ORDER — ONDANSETRON HCL 4 MG/2ML IJ SOLN: 4.0000 mg | Freq: Four times a day (QID) | INTRAMUSCULAR | Status: DC | PRN

## 2022-07-21 NOTE — Progress Notes (Signed)
Orthopedic Tech Progress Note Patient Details:  [name] 1929/11/23 [id number]  Ortho Devices Type of Ortho Device: Sugartong splint, Sling immobilizer Ortho Device/Splint Location: LUE Ortho Device/Splint Interventions: Ordered, Adjustment, Application  PT was moving a lot during application due to arthritis and made it hard applying splint.  Post Interventions Patient Tolerated: Well  Al Decant 07/21/2022, 2:22 PM

## 2022-07-21 NOTE — ED Notes (Signed)
Pt states pain feels like a broken rib. Pt also endorses a fall last week with R knee pain.

## 2022-07-21 NOTE — Progress Notes (Signed)
Pt refused to have MRI. Explained to the pt the reason to have it but still refused. Offered the pt the option to have it tomorrow but still refused. Pt stated 'What's the sense of having it, I am 89 years old, I have lived long enough.' Will notify the provider.

## 2022-07-21 NOTE — H&P (Signed)
History and Physical    Patient: [name] EXB:[id number] DOB: [birth year] DOA: 07/21/2022 DOS: the patient was seen and examined on 07/21/2022 PCP: Joycelyn Man, NP  Patient coming from: [facility]  Chief Complaint:  Chief Complaint  Patient presents with   Chest Pain   HPI: [name] is a 89 y.o. female with medical history significant of anxiety, depression, essential tremor, paroxysmal atrial fibrillation on Eliquis who presents to the emergency department via EMS from [facility] due to complete left-sided chest pain.  Patient states that she had a fall about a week ago with subsequent left-sided chest pain and left hand pain that worsens on movement.  She denied hitting her head or any loss of consciousness.  She also presents with swelling and redness of right knee and was currently on antibiotics for this.  She complained of increasing shortness of breath and nonproductive cough which has been going on for several days.  Patient endorsed history of pleural effusions and states that she had drainage of the chest a few weeks ago.  She denied fever, chills, palpitations, nausea, vomiting, abdominal pain, blood in stools.  Tylenol given at the living facility did not provide any relief of the rib pain or left hand pain.  EMS was activated and patient was taken to the ED for further evaluation and management.  ED Course:  In the emergency department, she was tachypneic with respiratory rate in low 20s, pulse 50 bpm, BP 120/49, temperature 98.2 F, O2 sat was 97%.  Workup in the ED showed normocytic anemia, WBC 11.5, MCV 87.7, platelets 202.  BMP was normal sodium of 134, blood glucose 157, creatinine 1.39 (baseline creatinine at 0.8-0.9).  Troponin x 2 was negative.  Albumin 3.3 CT head and CT cervical spine without contrast showed 1. Discrete cystic area in the right thalamus has increased in size relative to 2022 prior and now measures 3.0 cm  with some regional mass effect. Findings are atypical for cystic encephalomalacia and recommend MRI with contrast to further evaluate and exclude malignancy or enhancement. 2. No evidence of acute hemorrhage. 3. Chronic microvascular ischemic change.  CT cervical spine:   No evidence of acute fracture or traumatic malalignment. CT chest, abdomen and pelvis without contrast showed Acute, mildly displaced fractures involving the left anterior 3rd through 6th ribs. Evidence of pneumothorax.  New focal airspace opacity in the anterior right lower lobe, which may be due to pulmonary contusion or pneumonia.  Stable small right and tiny left pleural effusions.  No evidence of traumatic injury within the abdomen or pelvis.  Stable moderate hiatal hernia.  Cholelithiasis. No radiographic evidence of cholecystitis. Left forearm x-ray showed acute, obliquely oriented fracture deformity involving the distal diaphysis of the ulna.  Osteopenia Chest x-ray showed small right pleural effusion with patchy right basilar airspace opacity, atelectasis versus pneumonia Right knee x-ray showed no acute findings Patient was treated with IV ceftriaxone and azithromycin, breathing treatment with Ventolin was provided, Tessalon x 1 was given, lidocaine patch was provided, Percocet was given and IV NS 500 mL was provided.  Hospitalist was asked to admit patient for further evaluation and management.  Review of Systems: Review of systems as noted in the HPI. All other systems reviewed and are negative.   Past Medical History:  Diagnosis Date   Rheumatoid arthritis    History reviewed. No pertinent surgical history.  Social History:  has no history on file for tobacco use, alcohol use, and drug  use.   Allergies  Allergen Reactions   Naproxen     Other reaction(s): HIVES   Other     Other reaction(s): DOES NOT HELP   Penicillins    Prozac [Fluoxetine]     Other reaction(s): HIVES    History reviewed. No  pertinent family history.  Prior to Admission medications   Medication Sig Start Date End Date Taking? Authorizing Provider  acetaminophen (TYLENOL) 325 MG tablet Take 650 mg by mouth every 6 (six) hours as needed for mild pain, fever or headache.    [provider]  ALPRAZolam Prudy Feeler) 0.5 MG tablet Take 0.5 mg by mouth 3 (three) times daily as needed for anxiety. 06/30/20   [provider]  oxyCODONE-acetaminophen (PERCOCET) 5-325 MG tablet Take 1 tablet by mouth every 6 (six) hours as needed. 07/02/20   Bethann Berkshire, MD  predniSONE (DELTASONE) 5 MG tablet Take 5 mg by mouth daily with breakfast. 04/13/20   [provider]  temazepam (RESTORIL) 30 MG capsule Take 30 mg by mouth at bedtime. 06/08/20   [provider]  venlafaxine XR (EFFEXOR-XR) 75 MG 24 hr capsule Take 75 mg by mouth daily. 04/13/20   [provider]    Physical Exam: BP 125/60   Pulse 86   Temp 98.2 F (36.8 C)   Resp (!) 21   SpO2 94%   General: 89 y.o. year-old female well developed well nourished in no acute distress.  Alert and oriented x3. HEENT: NCAT, EOMI, dry mucous membrane Neck: Supple, trachea medial Cardiovascular: Irregularly irregular rate and rhythm with no rubs or gallops.  No thyromegaly or JVD noted.  2/4 pulses in all 4 extremities. Respiratory: Right lower lobe with decreased breath sounds.  No audible wheezes  Abdomen: Soft, nontender nondistended with normal bowel sounds x4 quadrants. Muskuloskeletal: Tender to palpation of left-sided chest wall and left mid forearm tenderness.  RLE swelling > LLE (chronic per patient).  Left forearm in splint. Neuro: CN II-XII intact, no focal neurologic deficits, sensation, reflexes intact Skin: No ulcerative lesions noted or rashes Psychiatry: Judgement and insight appear normal. Mood is appropriate for condition and setting          Labs on Admission:  Basic Metabolic Panel: Recent Labs  Lab 07/21/22 1155  NA 134*   K 3.5  CL 98  CO2 25  GLUCOSE 157*  BUN 21  CREATININE 1.39*  CALCIUM 8.8*   Liver Function Tests: Recent Labs  Lab 07/21/22 1155  AST 19  ALT 11  ALKPHOS 105  BILITOT 0.6  PROT 7.1  ALBUMIN 3.3*   No results for input(s): "LIPASE", "AMYLASE" in the last 168 hours. No results for input(s): "AMMONIA" in the last 168 hours. CBC: Recent Labs  Lab 07/21/22 1155  WBC 11.5*  HGB 8.8*  HCT 29.3*  MCV 87.7  PLT 202   Cardiac Enzymes: No results for input(s): "CKTOTAL", "CKMB", "CKMBINDEX", "TROPONINI" in the last 168 hours.  BNP (last 3 results) No results for input(s): "BNP" in the last 8760 hours.  ProBNP (last 3 results) No results for input(s): "PROBNP" in the last 8760 hours.  CBG: No results for input(s): "GLUCAP" in the last 168 hours.  Radiological Exams on Admission: CT Head Wo Contrast  Result Date: 07/21/2022 CLINICAL DATA:  unclear if falls, on eliquis, diffuse body pain EXAM: CT HEAD WITHOUT CONTRAST CT CERVICAL SPINE WITHOUT CONTRAST TECHNIQUE: Multidetector CT imaging of the head and cervical spine was performed following the standard protocol without intravenous  contrast. Multiplanar CT image reconstructions of the cervical spine were also generated. RADIATION DOSE REDUCTION: This exam was performed according to the departmental dose-optimization program which includes automated exposure control, adjustment of the mA and/or kV according to patient size and/or use of iterative reconstruction technique. COMPARISON:  CT head 11/30/2020. FINDINGS: CT HEAD FINDINGS Brain: Discrete cystic area in the right thalamus has increased in size relative to 2022 prior and now measures 3.0 cm with some regional mass effect. Patchy white matter hypodensities are nonspecific but compatible with chronic microvascular ischemic disease. No evidence of acute large vascular territory infarct. No evidence of acute hemorrhage. No hydrocephalus. Vascular: Calcific atherosclerosis.  Skull: No acute fracture.  High left scalp contusion. Sinuses/Orbits: Clear sinuses.  No acute orbital findings. Other: Small left mastoid effusion. CT CERVICAL SPINE FINDINGS Alignment: Mild anterolisthesis of C3 on C4 and C4 on C5, likely degenerative given facet arthropathy at these levels. Otherwise, no substantial sagittal subluxation. Skull base and vertebrae: No evidence of acute fracture. Vertebral body heights are maintained. Osteopenia. Soft tissues and spinal canal: No prevertebral fluid or swelling. No visible canal hematoma. Disc levels: Severe multilevel degenerative change including facet and uncovertebral hypertrophy with varying degrees of neural foraminal stenosis. Upper chest: Please see concurrent CT of the chest for intrathoracic evaluation. Other: Chronically enlarged thyroid. IMPRESSION: CTA head: 1. Discrete cystic area in the right thalamus has increased in size relative to 2022 prior and now measures 3.0 cm with some regional mass effect. Findings are atypical for cystic encephalomalacia and recommend MRI with contrast to further evaluate and exclude malignancy or enhancement. 2. No evidence of acute hemorrhage. 3. Chronic microvascular ischemic change. CT cervical spine: No evidence of acute fracture or traumatic malalignment. Electronically Signed   By: Feliberto Harts M.D.   On: 07/21/2022 14:39   CT Cervical Spine Wo Contrast  Result Date: 07/21/2022 CLINICAL DATA:  unclear if falls, on eliquis, diffuse body pain EXAM: CT HEAD WITHOUT CONTRAST CT CERVICAL SPINE WITHOUT CONTRAST TECHNIQUE: Multidetector CT imaging of the head and cervical spine was performed following the standard protocol without intravenous contrast. Multiplanar CT image reconstructions of the cervical spine were also generated. RADIATION DOSE REDUCTION: This exam was performed according to the departmental dose-optimization program which includes automated exposure control, adjustment of the mA and/or kV  according to patient size and/or use of iterative reconstruction technique. COMPARISON:  CT head 11/30/2020. FINDINGS: CT HEAD FINDINGS Brain: Discrete cystic area in the right thalamus has increased in size relative to 2022 prior and now measures 3.0 cm with some regional mass effect. Patchy white matter hypodensities are nonspecific but compatible with chronic microvascular ischemic disease. No evidence of acute large vascular territory infarct. No evidence of acute hemorrhage. No hydrocephalus. Vascular: Calcific atherosclerosis. Skull: No acute fracture.  High left scalp contusion. Sinuses/Orbits: Clear sinuses.  No acute orbital findings. Other: Small left mastoid effusion. CT CERVICAL SPINE FINDINGS Alignment: Mild anterolisthesis of C3 on C4 and C4 on C5, likely degenerative given facet arthropathy at these levels. Otherwise, no substantial sagittal subluxation. Skull base and vertebrae: No evidence of acute fracture. Vertebral body heights are maintained. Osteopenia. Soft tissues and spinal canal: No prevertebral fluid or swelling. No visible canal hematoma. Disc levels: Severe multilevel degenerative change including facet and uncovertebral hypertrophy with varying degrees of neural foraminal stenosis. Upper chest: Please see concurrent CT of the chest for intrathoracic evaluation. Other: Chronically enlarged thyroid. IMPRESSION: CTA head: 1. Discrete cystic area in the right thalamus has increased in  size relative to 2022 prior and now measures 3.0 cm with some regional mass effect. Findings are atypical for cystic encephalomalacia and recommend MRI with contrast to further evaluate and exclude malignancy or enhancement. 2. No evidence of acute hemorrhage. 3. Chronic microvascular ischemic change. CT cervical spine: No evidence of acute fracture or traumatic malalignment. Electronically Signed   By: Feliberto Harts M.D.   On: 07/21/2022 14:39   CT CHEST ABDOMEN PELVIS WO CONTRAST  Result Date:  07/21/2022 CLINICAL DATA:  Several falls this week. Right-sided chest and abdominal pain. EXAM: CT CHEST, ABDOMEN AND PELVIS WITHOUT CONTRAST TECHNIQUE: Multidetector CT imaging of the chest, abdomen and pelvis was performed following the standard protocol without IV contrast. RADIATION DOSE REDUCTION: This exam was performed according to the departmental dose-optimization program which includes automated exposure control, adjustment of the mA and/or kV according to patient size and/or use of iterative reconstruction technique. COMPARISON:  Chest CTA on 05/30/2022 FINDINGS: CT CHEST FINDINGS Cardiovascular: No evidence of mediastinal hematoma. No pericardial effusion. Aortic and coronary atherosclerotic calcification incidentally noted. Mediastinum/Nodes: No evidence of hemorrhage or pneumomediastinum. Stable multinodular goiter. No pathologically enlarged lymph nodes identified on this noncontrast exam. Lungs/Pleura: Small right and tiny left pleural effusion show no significant change compared to prior exam. No evidence of pneumothorax. Right lower lobe atelectasis and scarring is again seen. New focal area of airspace opacity with air bronchograms is seen in the anterior right lower lobe, which may be due to pulmonary contusion or pneumonia. Stable elevation of right hemidiaphragm. Musculoskeletal: Acute, mildly displaced fractures are seen involving the left anterior 3rd through 6th ribs. Old compression fracture of the T11 vertebral body is unchanged in appearance. CT ABDOMEN PELVIS FINDINGS Hepatobiliary: No hepatic parenchymal injury or mass identified on this noncontrast exam. Layering sludge or tiny gallstones again noted. No evidence of cholecystitis or biliary ductal dilatation. Pancreas: No parenchymal abnormality identified on this noncontrast exam. Spleen: No evidence of parenchymal injury on this noncontrast exam. Adrenal/Urinary Tract: No hemorrhage or parenchymal injury identified on this  noncontrast exam. Stable bilateral renal parenchymal scarring. No evidence of hydronephrosis. Distended urinary bladder noted. Stomach/Bowel: Moderate hiatal hernia again seen. No evidence of hemoperitoneum or free intraperitoneal air. Otherwise unremarkable. Vascular/Lymphatic: No evidence of retroperitoneal hemorrhage. Aortic atherosclerotic calcification incidentally noted. No pathologically enlarged lymph nodes identified. Reproductive: Prior hysterectomy noted. Adnexal regions are unremarkable in appearance. Other:  None. Musculoskeletal: No acute fractures or suspicious bone lesions identified. IMPRESSION: Acute, mildly displaced fractures involving the left anterior 3rd through 6th ribs. Evidence of pneumothorax. New focal airspace opacity in the anterior right lower lobe, which may be due to pulmonary contusion or pneumonia. Stable small right and tiny left pleural effusions. No evidence of traumatic injury within the abdomen or pelvis. Stable moderate hiatal hernia. Cholelithiasis. No radiographic evidence of cholecystitis. Aortic Atherosclerosis (ICD10-I70.0). Electronically Signed   By: Danae Orleans M.D.   On: 07/21/2022 14:37   DG Knee 2 Views Right  Result Date: 07/21/2022 CLINICAL DATA:  Fall last week.  Right knee pain. EXAM: RIGHT KNEE - 2 VIEW COMPARISON:  07/02/2020 FINDINGS: No evidence of fracture, dislocation, or joint effusion. Enthesopathic changes are seen involving the patella. No other changes of arthropathy are seen. Extensive peripheral vascular calcification noted. IMPRESSION: No acute findings. Electronically Signed   By: Danae Orleans M.D.   On: 07/21/2022 13:22   DG Forearm Left  Result Date: 07/21/2022 CLINICAL DATA:  Right knee and left arm pain status post fall EXAM: LEFT FOREARM -  2 VIEW COMPARISON:  None Available. FINDINGS: The bones appear diffusely osteopenic. There is an acute, obliquely oriented fracture deformity involving the distal diaphysis of the ulna. No  additional fractures identified. Degenerative changes are identified at the basilar joint and radiocarpal joint. Chondrocalcinosis. IMPRESSION: 1. Acute, obliquely oriented fracture deformity involving the distal diaphysis of the ulna. 2. Osteopenia. Electronically Signed   By: Signa Kell M.D.   On: 07/21/2022 13:20   DG Chest 2 View  Result Date: 07/21/2022 CLINICAL DATA:  No chest pain after fall EXAM: CHEST - 2 VIEW COMPARISON:  05/30/2022 FINDINGS: Stable cardiomegaly. Aortic atherosclerosis. Small right pleural effusion with patchy right basilar airspace opacity. Left lung appears clear. No pneumothorax. Bones are demineralized. No definite fracture. IMPRESSION: Small right pleural effusion with patchy right basilar airspace opacity, atelectasis versus pneumonia. Electronically Signed   By: Duanne Guess D.O.   On: 07/21/2022 12:26    EKG: I independently viewed the EKG done and my findings are as followed: Atrial fibrillation with rate control  Assessment/Plan Present on Admission: **None**  Principal Problem:   CAP (community acquired pneumonia) Active Problems:   Left rib fracture   Left ulnar fracture   AKI (acute kidney injury)   Pain and swelling of right knee   Dehydration   Hypoalbuminemia due to protein-calorie malnutrition   Thalamic mass   History of atrial fibrillation   Essential tremor   Depression   Right leg swelling  Acute respiratory failure with hypoxia secondary to CAP POA Patient was started on traction and azithromycin, we shall continue same at this time with plan to de-escalate/discontinue based on blood culture, sputum culture, urine Legionella, strep pneumo and procalcitonin Continue Tylenol as needed Continue Mucinex, incentive spirometry, flutter valve Continue supplemental oxygen via [place] to maintain O2 sat > 92% with plan to wean patient off this as tolerated (patient does not use supplemental oxygen at baseline).  Left rib fracture Lidocaine  patch was placed Continue Tylenol as needed for mild pain Continue Percocet 5-325 mg every 4 hours as needed for moderate/severe pain Continue fall precaution On-call surgeon Dr. Corliss Skains was consulted and stated patient does not need trauma admit at this time since fall was about a week ago and it was only rib fractures, admission to [facility] in case patient will need specialty consultants was recommended per EDP.  Left ulna fracture This will splinted Continue pain medication as described above for rib fracture Continue fall precaution Continue PT/OT eval and treat Orthopedist on-call Dr. Odis Hollingshead was consulted and though he does not manage hand, he was in agreement with sugar-tong splint placed and stated that patient will need hand surgery consult evaluation per EDP  Right knee erythema, pain and swelling Continue pain medication as described above for wrist fracture and left ulna fracture Continue antibiotics as described above for pneumonia  Acute kidney injury Dehydration Creatinine 1.39 (baseline creatinine at 0.8-0.9). Continue gentle hydration Renally adjust medications, avoid nephrotoxic agents/dehydration/hypotension  Hypoalbuminemia possibly secondary to mild protein caloric malnutrition Albumin 3.3, protein supplement will be provided  Thalamic mass Right thalamic mass (chronic) showed increase in size relative to 2022 and is currently at 3.0 cm.  Neurologist (Dr. Selina Cooley) was consulted and recommended MRI brain with and without contrast to ensure no underlying malignancy or concerning findings.  Neurology can be consulted inpatient if there is any underlying malignancy or concerning findings per EDP.  RLE swelling > LLE Right lower extremity ultrasound to rule out DVT will be done  Reported  history of chronic atrial fibrillation EKG personally reviewed showed atrial fibrillation with rate controlled, however, patient has no rate control or anticoagulant on MAR at this  time, we shall await updated med rec Patient will be placed on therapeutic Lovenox in the meantime  Depression Continue venlafaxine  Essential tremor Continue home medication when med rec is updated  DVT prophylaxis: Lovenox  Code Status: DNR  Family Communication: None at bedside  Consults: None  Severity of Illness: The appropriate patient status for this patient is INPATIENT. Inpatient status is judged to be reasonable and necessary in order to provide the required intensity of service to ensure the patient's safety. The patient's presenting symptoms, physical exam findings, and initial radiographic and laboratory data in the context of their chronic comorbidities is felt to place them at high risk for further clinical deterioration. Furthermore, it is not anticipated that the patient will be medically stable for discharge from the hospital within 2 midnights of admission.   * I certify that at the point of admission it is my clinical judgment that the patient will require inpatient hospital care spanning beyond 2 midnights from the point of admission due to high intensity of service, high risk for further deterioration and high frequency of surveillance required.*  Author: Frankey Shown, DO 07/21/2022 5:23 PM  For on call review [email or web address].

## 2022-07-21 NOTE — Progress Notes (Addendum)
ANTICOAGULATION CONSULT NOTE - Initial Consult  Pharmacy Consult for Lovenox Indication: atrial fibrillation  Allergies  Allergen Reactions   Naproxen     Other reaction(s): HIVES   Other     Other reaction(s): DOES NOT HELP   Penicillins    Prozac [Fluoxetine]     Other reaction(s): HIVES    Patient Measurements: Height: 5\' 7"  (170.2 cm) Weight: 60.8 kg (134 lb 0.6 oz) IBW/kg (Calculated) : 61.6 Heparin Dosing Weight:   Vital Signs: Temp: 98.2 F (36.8 C) (04/14 1515) BP: 150/70 (04/14 1831) Pulse Rate: 84 (04/14 1831)  Labs: Recent Labs    07/21/22 1155 07/21/22 1410  HGB 8.8*  --   HCT 29.3*  --   PLT 202  --   CREATININE 1.39*  --   TROPONINIHS 11 10    Estimated Creatinine Clearance: 25.8 mL/min (A) (by C-G formula based on SCr of 1.39 mg/dL (H)).   Medical History: Past Medical History:  Diagnosis Date   Rheumatoid arthritis     Medications:  Medications Prior to Admission  Medication Sig Dispense Refill Last Dose   acetaminophen (TYLENOL) 325 MG tablet Take 650 mg by mouth every 6 (six) hours as needed for mild pain, fever or headache.      ALPRAZolam (XANAX) 0.5 MG tablet Take 0.5 mg by mouth 3 (three) times daily as needed for anxiety.      oxyCODONE-acetaminophen (PERCOCET) 5-325 MG tablet Take 1 tablet by mouth every 6 (six) hours as needed. 20 tablet 0    predniSONE (DELTASONE) 5 MG tablet Take 5 mg by mouth daily with breakfast.      temazepam (RESTORIL) 30 MG capsule Take 30 mg by mouth at bedtime.      venlafaxine XR (EFFEXOR-XR) 75 MG 24 hr capsule Take 75 mg by mouth daily.      Scheduled:   dextromethorphan-guaiFENesin  1 tablet Oral BID   feeding supplement (GLUCERNA SHAKE)  237 mL Oral TID BM   lidocaine  1 patch Transdermal Q24H   venlafaxine XR  75 mg Oral Daily   Infusions:   sodium chloride     [START ON 07/22/2022] azithromycin     [START ON 07/22/2022] cefTRIAXone (ROCEPHIN)  IV      Assessment: Pt was tx from [facility] for CP.  She has a hx of AF on apixaban. She is from [facility]. Crcl<30 today due to her AKI. We will follow up tomorrow and adjust lovenox if needed. Pt had a recent fall with rib/ulna fx.   LD apixaban 4/14 @0700  Scr 1.39 Hgb 8.8 Plt wnl  Goal of Therapy:  Anti-Xa level 0.6-1 units/ml 4hrs after LMWH dose given Monitor platelets by anticoagulation protocol: Yes   Plan:  Lovenox 60mg  SQ q24 Adjust dose in AM if needed F/u resume  Ulyses Southward, PharmD, [place], AAHIVP, CPP Infectious Disease Pharmacist 07/21/2022 7:48 PM

## 2022-07-21 NOTE — ED Provider Notes (Signed)
[place] EMERGENCY DEPARTMENT AT [facility] Provider Note   CSN: [id number] Arrival date & time: 07/21/22  1117     History  Chief Complaint  Patient presents with   Chest Pain    AKEENA LONGWELL is a 89 y.o. female.  With PMH of rheumatoid arthritis, paroxysmal A-fib on Eliquis, depression, HTN who presents from her facility at [facility] with mainly complaints of pain all over for me but also chest wall pain.  She had a fall about a week ago but denies hitting head or losing consciousness.  She does have erythema and pain of her right knee which she reports currently being on antibiotics for but she is unsure which antibiotics.  She has had mild coughing and shortness of breath with symptoms.  No vomiting, no diarrhea she reports constipation.  No abdominal pain.  Also complaining of pain in left arm when she was lifted by EMS staff today.  She feels like something is out of place.  She reports having history of pleural effusions and has had previous drainage performed a few weeks prior.  She has had no fevers or chills.  She reports no diaphoresis.  She denies any active bleeding in stools or elsewhere.  She has been given Tylenol without relief.  She says she has not given any other pain medicines.   Chest Pain      Home Medications Prior to Admission medications   Medication Sig Start Date End Date Taking? Authorizing Provider  acetaminophen (TYLENOL) 325 MG tablet Take 650 mg by mouth every 6 (six) hours as needed for mild pain, fever or headache.    [provider]  ALPRAZolam Prudy Feeler) 0.5 MG tablet Take 0.5 mg by mouth 3 (three) times daily as needed for anxiety. 06/30/20   [provider]  oxyCODONE-acetaminophen (PERCOCET) 5-325 MG tablet Take 1 tablet by mouth every 6 (six) hours as needed. 07/02/20   Bethann Berkshire, MD  predniSONE (DELTASONE) 5 MG tablet Take 5 mg by mouth daily with breakfast. 04/13/20   [provider]   temazepam (RESTORIL) 30 MG capsule Take 30 mg by mouth at bedtime. 06/08/20   [provider]  venlafaxine XR (EFFEXOR-XR) 75 MG 24 hr capsule Take 75 mg by mouth daily. 04/13/20   [provider]      Allergies    Naproxen, Other, Penicillins, and Prozac [fluoxetine]    Review of Systems   Review of Systems  Cardiovascular:  Positive for chest pain.    Physical Exam Updated Vital Signs BP 125/60   Pulse 86   Temp 98.2 F (36.8 C)   Resp (!) 21   SpO2 95%  Physical Exam Constitutional: Alert and oriented.  GCS 15.  Chronically ill-appearing but no acute distress Eyes: Conjunctivae are normal. ENT      Head: Normocephalic and atraumatic.      Neck: No stridor.  No midline tenderness step-off or deformity Cardiovascular: S1, S2, irregularly irregular rhythm, regular rate.Warm and well perfused.,  Bilateral chest wall tenderness without external evidence of trauma, no ecchymoses or hematoma Respiratory: Normal respiratory effort.  Decreased breath sounds on the right lower lung and midlung.  O2 sat 95 on 3 L nasal cannula Gastrointestinal: Soft and nontender.  No rebound or guarding Musculoskeletal: Tenderness to left mid forearm with chronic appearing deformity around the elbow.  No tenderness at this site.  Full grip strength intact.  Sensation intact distally.  Palpable radial pulses.  Right lower leg: Mild erythema and abrasion overlying right anterior knee full range of motion intact and sensation intact and pulses intact distally      Left lower leg: No tenderness or edema. Neurologic: Normal speech and language.  No facial droop.  Moves all extremities against gravity equally.  Sensation grossly intact.  No gross focal neurologic deficits are appreciated. Skin: Skin is warm, dry.  Diffuse old ecchymoses of bilateral arms Psychiatric: Mood and affect are normal. Speech and behavior are normal.  ED Results / Procedures / Treatments   Labs (all labs ordered  are listed, but only abnormal results are displayed) Labs Reviewed  BASIC METABOLIC PANEL - Abnormal; Notable for the following components:      Result Value   Sodium 134 (*)    Glucose, Bld 157 (*)    Creatinine, Ser 1.39 (*)    Calcium 8.8 (*)    GFR, Estimated 36 (*)    All other components within normal limits  CBC - Abnormal; Notable for the following components:   WBC 11.5 (*)    RBC 3.34 (*)    Hemoglobin 8.8 (*)    HCT 29.3 (*)    RDW 19.6 (*)    All other components within normal limits  HEPATIC FUNCTION PANEL - Abnormal; Notable for the following components:   Albumin 3.3 (*)    All other components within normal limits  TROPONIN I (HIGH SENSITIVITY)  TROPONIN I (HIGH SENSITIVITY)    EKG EKG Interpretation  Date/Time:  Sunday July 21 2022 11:30:46 EDT Ventricular Rate:  66 PR Interval:    QRS Duration: 86 QT Interval:  406 QTC Calculation: 426 R Axis:   68 Text Interpretation: Atrial fibrillation Anterior infarct, old Minimal ST depression, inferior leads no reciprocal changes Confirmed by Vivien Rossetti ([id number]) on 07/21/2022 11:36:47 AM  Radiology CT Head Wo Contrast  Result Date: 07/21/2022 CLINICAL DATA:  unclear if falls, on eliquis, diffuse body pain EXAM: CT HEAD WITHOUT CONTRAST CT CERVICAL SPINE WITHOUT CONTRAST TECHNIQUE: Multidetector CT imaging of the head and cervical spine was performed following the standard protocol without intravenous contrast. Multiplanar CT image reconstructions of the cervical spine were also generated. RADIATION DOSE REDUCTION: This exam was performed according to the departmental dose-optimization program which includes automated exposure control, adjustment of the mA and/or kV according to patient size and/or use of iterative reconstruction technique. COMPARISON:  CT head 11/30/2020. FINDINGS: CT HEAD FINDINGS Brain: Discrete cystic area in the right thalamus has increased in size relative to 2022 prior and now measures 3.0 cm  with some regional mass effect. Patchy white matter hypodensities are nonspecific but compatible with chronic microvascular ischemic disease. No evidence of acute large vascular territory infarct. No evidence of acute hemorrhage. No hydrocephalus. Vascular: Calcific atherosclerosis. Skull: No acute fracture.  High left scalp contusion. Sinuses/Orbits: Clear sinuses.  No acute orbital findings. Other: Small left mastoid effusion. CT CERVICAL SPINE FINDINGS Alignment: Mild anterolisthesis of C3 on C4 and C4 on C5, likely degenerative given facet arthropathy at these levels. Otherwise, no substantial sagittal subluxation. Skull base and vertebrae: No evidence of acute fracture. Vertebral body heights are maintained. Osteopenia. Soft tissues and spinal canal: No prevertebral fluid or swelling. No visible canal hematoma. Disc levels: Severe multilevel degenerative change including facet and uncovertebral hypertrophy with varying degrees of neural foraminal stenosis. Upper chest: Please see concurrent CT of the chest for intrathoracic evaluation. Other: Chronically enlarged thyroid. IMPRESSION: CTA head: 1. Discrete cystic area in the right thalamus  has increased in size relative to 2022 prior and now measures 3.0 cm with some regional mass effect. Findings are atypical for cystic encephalomalacia and recommend MRI with contrast to further evaluate and exclude malignancy or enhancement. 2. No evidence of acute hemorrhage. 3. Chronic microvascular ischemic change. CT cervical spine: No evidence of acute fracture or traumatic malalignment. Electronically Signed   By: Feliberto Harts M.D.   On: 07/21/2022 14:39   CT Cervical Spine Wo Contrast  Result Date: 07/21/2022 CLINICAL DATA:  unclear if falls, on eliquis, diffuse body pain EXAM: CT HEAD WITHOUT CONTRAST CT CERVICAL SPINE WITHOUT CONTRAST TECHNIQUE: Multidetector CT imaging of the head and cervical spine was performed following the standard protocol without  intravenous contrast. Multiplanar CT image reconstructions of the cervical spine were also generated. RADIATION DOSE REDUCTION: This exam was performed according to the departmental dose-optimization program which includes automated exposure control, adjustment of the mA and/or kV according to patient size and/or use of iterative reconstruction technique. COMPARISON:  CT head 11/30/2020. FINDINGS: CT HEAD FINDINGS Brain: Discrete cystic area in the right thalamus has increased in size relative to 2022 prior and now measures 3.0 cm with some regional mass effect. Patchy white matter hypodensities are nonspecific but compatible with chronic microvascular ischemic disease. No evidence of acute large vascular territory infarct. No evidence of acute hemorrhage. No hydrocephalus. Vascular: Calcific atherosclerosis. Skull: No acute fracture.  High left scalp contusion. Sinuses/Orbits: Clear sinuses.  No acute orbital findings. Other: Small left mastoid effusion. CT CERVICAL SPINE FINDINGS Alignment: Mild anterolisthesis of C3 on C4 and C4 on C5, likely degenerative given facet arthropathy at these levels. Otherwise, no substantial sagittal subluxation. Skull base and vertebrae: No evidence of acute fracture. Vertebral body heights are maintained. Osteopenia. Soft tissues and spinal canal: No prevertebral fluid or swelling. No visible canal hematoma. Disc levels: Severe multilevel degenerative change including facet and uncovertebral hypertrophy with varying degrees of neural foraminal stenosis. Upper chest: Please see concurrent CT of the chest for intrathoracic evaluation. Other: Chronically enlarged thyroid. IMPRESSION: CTA head: 1. Discrete cystic area in the right thalamus has increased in size relative to 2022 prior and now measures 3.0 cm with some regional mass effect. Findings are atypical for cystic encephalomalacia and recommend MRI with contrast to further evaluate and exclude malignancy or enhancement. 2. No  evidence of acute hemorrhage. 3. Chronic microvascular ischemic change. CT cervical spine: No evidence of acute fracture or traumatic malalignment. Electronically Signed   By: Feliberto Harts M.D.   On: 07/21/2022 14:39   CT CHEST ABDOMEN PELVIS WO CONTRAST  Result Date: 07/21/2022 CLINICAL DATA:  Several falls this week. Right-sided chest and abdominal pain. EXAM: CT CHEST, ABDOMEN AND PELVIS WITHOUT CONTRAST TECHNIQUE: Multidetector CT imaging of the chest, abdomen and pelvis was performed following the standard protocol without IV contrast. RADIATION DOSE REDUCTION: This exam was performed according to the departmental dose-optimization program which includes automated exposure control, adjustment of the mA and/or kV according to patient size and/or use of iterative reconstruction technique. COMPARISON:  Chest CTA on 05/30/2022 FINDINGS: CT CHEST FINDINGS Cardiovascular: No evidence of mediastinal hematoma. No pericardial effusion. Aortic and coronary atherosclerotic calcification incidentally noted. Mediastinum/Nodes: No evidence of hemorrhage or pneumomediastinum. Stable multinodular goiter. No pathologically enlarged lymph nodes identified on this noncontrast exam. Lungs/Pleura: Small right and tiny left pleural effusion show no significant change compared to prior exam. No evidence of pneumothorax. Right lower lobe atelectasis and scarring is again seen. New focal area of airspace opacity  with air bronchograms is seen in the anterior right lower lobe, which may be due to pulmonary contusion or pneumonia. Stable elevation of right hemidiaphragm. Musculoskeletal: Acute, mildly displaced fractures are seen involving the left anterior 3rd through 6th ribs. Old compression fracture of the T11 vertebral body is unchanged in appearance. CT ABDOMEN PELVIS FINDINGS Hepatobiliary: No hepatic parenchymal injury or mass identified on this noncontrast exam. Layering sludge or tiny gallstones again noted. No evidence  of cholecystitis or biliary ductal dilatation. Pancreas: No parenchymal abnormality identified on this noncontrast exam. Spleen: No evidence of parenchymal injury on this noncontrast exam. Adrenal/Urinary Tract: No hemorrhage or parenchymal injury identified on this noncontrast exam. Stable bilateral renal parenchymal scarring. No evidence of hydronephrosis. Distended urinary bladder noted. Stomach/Bowel: Moderate hiatal hernia again seen. No evidence of hemoperitoneum or free intraperitoneal air. Otherwise unremarkable. Vascular/Lymphatic: No evidence of retroperitoneal hemorrhage. Aortic atherosclerotic calcification incidentally noted. No pathologically enlarged lymph nodes identified. Reproductive: Prior hysterectomy noted. Adnexal regions are unremarkable in appearance. Other:  None. Musculoskeletal: No acute fractures or suspicious bone lesions identified. IMPRESSION: Acute, mildly displaced fractures involving the left anterior 3rd through 6th ribs. Evidence of pneumothorax. New focal airspace opacity in the anterior right lower lobe, which may be due to pulmonary contusion or pneumonia. Stable small right and tiny left pleural effusions. No evidence of traumatic injury within the abdomen or pelvis. Stable moderate hiatal hernia. Cholelithiasis. No radiographic evidence of cholecystitis. Aortic Atherosclerosis (ICD10-I70.0). Electronically Signed   By: Danae Orleans M.D.   On: 07/21/2022 14:37   DG Knee 2 Views Right  Result Date: 07/21/2022 CLINICAL DATA:  Fall last week.  Right knee pain. EXAM: RIGHT KNEE - 2 VIEW COMPARISON:  07/02/2020 FINDINGS: No evidence of fracture, dislocation, or joint effusion. Enthesopathic changes are seen involving the patella. No other changes of arthropathy are seen. Extensive peripheral vascular calcification noted. IMPRESSION: No acute findings. Electronically Signed   By: Danae Orleans M.D.   On: 07/21/2022 13:22   DG Forearm Left  Result Date: 07/21/2022 CLINICAL  DATA:  Right knee and left arm pain status post fall EXAM: LEFT FOREARM - 2 VIEW COMPARISON:  None Available. FINDINGS: The bones appear diffusely osteopenic. There is an acute, obliquely oriented fracture deformity involving the distal diaphysis of the ulna. No additional fractures identified. Degenerative changes are identified at the basilar joint and radiocarpal joint. Chondrocalcinosis. IMPRESSION: 1. Acute, obliquely oriented fracture deformity involving the distal diaphysis of the ulna. 2. Osteopenia. Electronically Signed   By: Signa Kell M.D.   On: 07/21/2022 13:20   DG Chest 2 View  Result Date: 07/21/2022 CLINICAL DATA:  No chest pain after fall EXAM: CHEST - 2 VIEW COMPARISON:  05/30/2022 FINDINGS: Stable cardiomegaly. Aortic atherosclerosis. Small right pleural effusion with patchy right basilar airspace opacity. Left lung appears clear. No pneumothorax. Bones are demineralized. No definite fracture. IMPRESSION: Small right pleural effusion with patchy right basilar airspace opacity, atelectasis versus pneumonia. Electronically Signed   By: Duanne Guess D.O.   On: 07/21/2022 12:26    Procedures .Critical Care  Performed by: Mardene Sayer, MD Authorized by: Mardene Sayer, MD   Critical care provider statement:    Critical care time (minutes):  45   Critical care was necessary to treat or prevent imminent or life-threatening deterioration of the following conditions:  Respiratory failure   Critical care was time spent personally by me on the following activities:  Development of treatment plan with patient or surrogate, discussions with consultants,  evaluation of patient's response to treatment, examination of patient, ordering and review of laboratory studies, ordering and review of radiographic studies, ordering and performing treatments and interventions, pulse oximetry, re-evaluation of patient's condition, review of old charts and obtaining history from patient or  surrogate   Care discussed with: admitting provider   .Ortho Injury Treatment  Date/Time: 07/21/2022 3:41 PM  Performed by: Mardene Sayer, MD Authorized by: Mardene Sayer, MD   Consent:    Consent obtained:  Verbal   Consent given by:  Patient   Risks discussed:  Fracture   Alternatives discussed:  No treatmentInjury location: wrist Location details: left wrist Injury type: fracture (ulna) Pre-procedure neurovascular assessment: neurovascularly intact  Anesthesia: Local anesthesia used: no  Patient sedated: NoSplint type: sugar tong Splint Applied by: Milon Dikes Post-procedure neurovascular assessment: post-procedure neurovascularly intact       Medications Ordered in ED Medications  lidocaine (LIDODERM) 5 % 1 patch (1 patch Transdermal Patch Applied 07/21/22 1453)  cefTRIAXone (ROCEPHIN) 1 g in sodium chloride 0.9 % 100 mL IVPB (1 g Intravenous New Bag/Given 07/21/22 1414)  azithromycin (ZITHROMAX) 500 mg in sodium chloride 0.9 % 250 mL IVPB (500 mg Intravenous New Bag/Given 07/21/22 1432)  oxyCODONE-acetaminophen (PERCOCET/ROXICET) 5-325 MG per tablet 1 tablet (1 tablet Oral Given 07/21/22 1428)  sodium chloride 0.9 % bolus 500 mL (500 mLs Intravenous New Bag/Given 07/21/22 1414)  albuterol (VENTOLIN HFA) 108 (90 Base) MCG/ACT inhaler 2 puff (2 puffs Inhalation Given 07/21/22 1453)  benzonatate (TESSALON) capsule 100 mg (100 mg Oral Given 07/21/22 1453)    ED Course/ Medical Decision Making/ A&P Clinical Course as of 07/21/22 1542  Sun Jul 21, 2022  1456 S/w on call surgeon Dr Corliss Skains- since fall about a week out, and only rib fractures, does not need trauma admit. Likely needs [facility] admit with medicine admission for treatment of aspiration pneumonia and for all specialty consultants. [VB]  1513 S/w Dr Selina Cooley, she said patient just requires MRI brain with and without contrast to ensure no underlying malignancy or concerning findings.  If there is underlying malignancy or  concerning findings, neurology can be consulted inpatient. [VB]  1523 S/w Dr Odis Hollingshead he is the on-call orthopedist, he does not manage hand but is in agreement with the sugar-tong splint placed.  If being admitted, she will need formal hand surgery consult for evaluation. [VB]    Clinical Course User Index [VB] Mardene Sayer, MD                             Medical Decision Making SHARIECE VIVEIROS is a 89 y.o. female.  With PMH of rheumatoid arthritis, paroxysmal A-fib on Eliquis, depression, HTN who presents from her facility at [facility] with mainly complaints of pain all over for me but also chest wall pain.    Patient's pain is reproducible concerning for underlying possible rib fracture.  She is also on 3 L nasal cannula for concerns for possible pneumonia.  Chest x-ray obtained which I personally reviewed showing right lower lobe consolidation and pleural effusion concerning for aspiration pneumonia.  EKG A-fib rate controlled with mild ST depressions inferior leads.  No reciprocal changes.  Reassuring high sensitive troponin 11, not concern for ACS.  White blood cell count elevation 11.5 with mild anemia hemoglobin 8.8.  Patient reports fall about 1 week prior thus CT head, C-spine chest abdomen pelvis without contrast was obtained due to AKI creatinine 1.39  today and GFR 36.  Findings showed right-sided aspiration pneumonia, left-sided 3 rib fractures with no associated pneumothorax.  She also had a left distal ulna fracture placed in sugar-tong splint.  CT head with no ICH which I personally reviewed however did show findings concerning for cystic encephalomalacia.  They recommend MRI brain with and without contrast for further evaluation.  S/w on call surgeon Dr Corliss Skains- since fall about a week out, and only rib fractures, does not need trauma admit. Likely needs [facility] admit with medicine admission for treatment of aspiration pneumonia and for all specialty consultants.  [VB] S/w Dr Selina Cooley, she said patient just requires MRI brain with and without contrast to ensure no underlying malignancy or concerning findings.  If there is underlying malignancy or concerning findings, neurology can be consulted inpatient. [VB] S/w Dr Odis Hollingshead he is the on-call orthopedist, he does not manage hand but is in agreement with the sugar-tong splint placed.  If being admitted, she will need formal hand surgery consult for evaluation. [VB]  Spoke with on-call hospitalist at [facility] for admission with plans to likely go to [facility] for admission for continued treatment of aspiration pneumonia.  She can also have inpatient consult from trauma team for rib fractures which are 25 week old as well as inpatient consult from hand surgery for distal ulna placed in sugar-tong splint.  Amount and/or Complexity of Data Reviewed Labs: ordered. Radiology: ordered.  Risk Prescription drug management. Decision regarding hospitalization.      Final Clinical Impression(s) / ED Diagnoses Final diagnoses:  Chest pain, unspecified type  Pleural effusion on right  Aspiration pneumonia of right lower lobe, unspecified aspiration pneumonia type  Closed fracture of multiple ribs of left side, initial encounter    Rx / DC Orders ED Discharge Orders     None         Mardene Sayer, MD 07/21/22 1542

## 2022-07-21 NOTE — ED Notes (Signed)
Patient transported to CT 

## 2022-07-21 NOTE — ED Triage Notes (Signed)
Pt bib ems from [facility] with L sided chest pain. Pain has been ongoing for several months with worsening yesterday. Pt in NAD. 12 lead showed controlled afib. Pt arrives on 2L  with saturations of 99%.

## 2022-07-21 NOTE — Progress Notes (Signed)
MRI Technologist at [facility] went into pt's ED room. Pt refused MRI at this time.

## 2022-07-22 ENCOUNTER — Inpatient Hospital Stay (HOSPITAL_COMMUNITY): Payer: Medicare Other

## 2022-07-22 DIAGNOSIS — M7989 Other specified soft tissue disorders: Secondary | ICD-10-CM | POA: Diagnosis not present

## 2022-07-22 DIAGNOSIS — Z515 Encounter for palliative care: Secondary | ICD-10-CM | POA: Diagnosis not present

## 2022-07-22 DIAGNOSIS — J189 Pneumonia, unspecified organism: Secondary | ICD-10-CM | POA: Diagnosis not present

## 2022-07-22 DIAGNOSIS — Z7189 Other specified counseling: Secondary | ICD-10-CM | POA: Diagnosis not present

## 2022-07-22 LAB — BASIC METABOLIC PANEL
Anion gap: 10 (ref 5–15)
BUN: 19 mg/dL (ref 8–23)
CO2: 24 mmol/L (ref 22–32)
Calcium: 8.8 mg/dL — ABNORMAL LOW (ref 8.9–10.3)
Chloride: 100 mmol/L (ref 98–111)
Creatinine, Ser: 1.46 mg/dL — ABNORMAL HIGH (ref 0.44–1.00)
GFR, Estimated: 34 mL/min — ABNORMAL LOW (ref >60)
Glucose, Bld: 93 mg/dL (ref 70–99)
Potassium: 3.2 mmol/L — ABNORMAL LOW (ref 3.5–5.1)
Sodium: 134 mmol/L — ABNORMAL LOW (ref 135–145)

## 2022-07-22 LAB — CBC
HCT: 27.2 % — ABNORMAL LOW (ref 36.0–46.0)
Hemoglobin: 8.6 g/dL — ABNORMAL LOW (ref 12.0–15.0)
MCH: 27 pg (ref 26.0–34.0)
MCHC: 31.6 g/dL (ref 30.0–36.0)
MCV: 85.3 fL (ref 80.0–100.0)
Platelets: 180 10*3/uL (ref 150–400)
RBC: 3.19 MIL/uL — ABNORMAL LOW (ref 3.87–5.11)
RDW: 19.6 % — ABNORMAL HIGH (ref 11.5–15.5)
WBC: 8.8 10*3/uL (ref 4.0–10.5)
nRBC: 0 % (ref 0.0–0.2)

## 2022-07-22 MED ORDER — ACETAMINOPHEN 500 MG PO TABS
1000.0000 mg | ORAL_TABLET | Freq: Two times a day (BID) | ORAL | Status: DC
  Administered 2022-07-22 – 2022-07-25 (×7): 1000 mg via ORAL
  Filled 2022-07-22 (×7): qty 2

## 2022-07-22 MED ORDER — RISAQUAD PO CAPS
1.0000 | ORAL_CAPSULE | Freq: Every day | ORAL | Status: DC
  Administered 2022-07-22 – 2022-07-25 (×4): 1 via ORAL
  Filled 2022-07-22 (×4): qty 1

## 2022-07-22 MED ORDER — VITAMIN B-12 1000 MCG PO TABS
1000.0000 ug | ORAL_TABLET | Freq: Every day | ORAL | Status: DC
  Administered 2022-07-22 – 2022-07-25 (×4): 1000 ug via ORAL
  Filled 2022-07-22 (×4): qty 1

## 2022-07-22 MED ORDER — GUAIFENESIN 100 MG/5ML PO LIQD: 200.0000 mg | Freq: Four times a day (QID) | ORAL | Status: DC | PRN

## 2022-07-22 MED ORDER — TRAZODONE HCL 50 MG PO TABS
50.0000 mg | ORAL_TABLET | Freq: Every day | ORAL | Status: DC
  Administered 2022-07-22 – 2022-07-24 (×3): 50 mg via ORAL
  Filled 2022-07-22 (×3): qty 1

## 2022-07-22 MED ORDER — SODIUM CHLORIDE 0.9 % IV SOLN: INTRAVENOUS | Status: DC

## 2022-07-22 MED ORDER — VITAMIN D 25 MCG (1000 UNIT) PO TABS
1000.0000 [IU] | ORAL_TABLET | Freq: Every day | ORAL | Status: DC
  Administered 2022-07-22 – 2022-07-25 (×4): 1000 [IU] via ORAL
  Filled 2022-07-22 (×4): qty 1

## 2022-07-22 MED ORDER — POTASSIUM CHLORIDE CRYS ER 20 MEQ PO TBCR: 40.0000 meq | EXTENDED_RELEASE_TABLET | Freq: Once | ORAL | Status: DC

## 2022-07-22 MED ORDER — PREDNISONE 5 MG PO TABS
5.0000 mg | ORAL_TABLET | Freq: Every day | ORAL | Status: DC
  Administered 2022-07-23 – 2022-07-25 (×3): 5 mg via ORAL
  Filled 2022-07-22 (×3): qty 1

## 2022-07-22 MED ORDER — ALPRAZOLAM 0.5 MG PO TABS
0.5000 mg | ORAL_TABLET | Freq: Two times a day (BID) | ORAL | Status: DC | PRN
  Administered 2022-07-23: 0.5 mg via ORAL
  Filled 2022-07-22: qty 1

## 2022-07-22 MED ORDER — TAMSULOSIN HCL 0.4 MG PO CAPS
0.4000 mg | ORAL_CAPSULE | Freq: Every day | ORAL | Status: DC
  Administered 2022-07-22 – 2022-07-25 (×4): 0.4 mg via ORAL
  Filled 2022-07-22 (×4): qty 1

## 2022-07-22 MED ORDER — POLYETHYLENE GLYCOL 3350 17 G PO PACK: 17.0000 g | PACK | Freq: Every day | ORAL | Status: DC | PRN

## 2022-07-22 MED ORDER — SENNOSIDES-DOCUSATE SODIUM 8.6-50 MG PO TABS
1.0000 | ORAL_TABLET | Freq: Every day | ORAL | Status: DC
  Administered 2022-07-22 – 2022-07-25 (×4): 1 via ORAL
  Filled 2022-07-22 (×4): qty 1

## 2022-07-22 MED ORDER — MIRTAZAPINE 15 MG PO TABS
7.5000 mg | ORAL_TABLET | Freq: Every day | ORAL | Status: DC
  Administered 2022-07-22 – 2022-07-24 (×3): 7.5 mg via ORAL
  Filled 2022-07-22 (×3): qty 1

## 2022-07-22 MED ORDER — APIXABAN 2.5 MG PO TABS
2.5000 mg | ORAL_TABLET | Freq: Two times a day (BID) | ORAL | Status: DC
  Administered 2022-07-22 – 2022-07-25 (×6): 2.5 mg via ORAL
  Filled 2022-07-22 (×6): qty 1

## 2022-07-22 MED ORDER — POTASSIUM CHLORIDE 10 MEQ/100ML IV SOLN
10.0000 meq | INTRAVENOUS | Status: AC
  Administered 2022-07-22 (×2): 10 meq via INTRAVENOUS
  Filled 2022-07-22 (×2): qty 100

## 2022-07-22 MED ORDER — ALPRAZOLAM 0.5 MG PO TABS
0.5000 mg | ORAL_TABLET | Freq: Two times a day (BID) | ORAL | Status: DC
  Administered 2022-07-22 – 2022-07-25 (×7): 0.5 mg via ORAL
  Filled 2022-07-22 (×7): qty 1

## 2022-07-22 MED ORDER — BISACODYL 10 MG RE SUPP
10.0000 mg | Freq: Every day | RECTAL | Status: DC | PRN
  Administered 2022-07-22: 10 mg via RECTAL
  Filled 2022-07-22: qty 1

## 2022-07-22 NOTE — Progress Notes (Signed)
PROGRESS NOTE Latasia Silberstein Amy  VFI:[id number] DOB: 23-Nov-1929 DOA: 07/21/2022 PCP: Joycelyn Man, NP  Brief Narrative/Hospital Course: 89 y.o.f w/ significant of anxiety, depression, essential tremor, PAF on Eliquis presented to the EDfrom [facility] due to left-sided chest pain/shortness of breaths, reported he had a fall and subsequently having chest pain and left ankle started and has been worsening, also complaining of swelling and redness of right knee and was currently on antibiotics for this. Patient endorsed history of pleural effusions and states that she had drainage of the chest a few weeks ago. She denied fever, chills, palpitations, nausea, vomiting, abdominal pain, blood in stools.     In CZ:[id number] with respiratory rate in low 20s, pulse 50 bpm, BP 120/49, temperature 98.2 F, O2 sat was 97%.  Workup in the ED showed normocytic anemia, WBC 11.5, MCV 87.7, platelets 202.  BMP was normal sodium of 134, blood glucose 157, creatinine 1.39 (baseline creatinine at 0.8-0.9).  Troponin x 2 was negative.  Albumin 3.3  CT head and CT cervical spine without contrast showed 1. Discrete cystic area in the right thalamus has increased in size relative to 2022 prior and now measures 3.0 cm with some regional mass effect. Findings are atypical for cystic encephalomalacia and recommend MRI with contrast to further evaluate and exclude malignancy or enhancement. 2. No evidence of acute hemorrhage. 3. Chronic microvascular ischemic change.  CT cervical spine:No evidence of acute fracture or traumatic malalignment.  CT chest, abdomen and pelvis without contrast showed: Acute, mildly displaced fractures involving the left anterior 3rd through 6th ribs. No evidence of pneumothorax. New focal airspace opacity in the anterior right lower lobe, which may be due to pulmonary contusion or pneumonia.  No evidence of traumatic injury within the abdomen or pelvis.  Stable moderate hiatal  hernia.  Cholelithiasis. No radiographic evidence of cholecystitis.  Left forearm x-ray showed acute, obliquely oriented fracture deformity involving the distal diaphysis of the ulna.  Osteopenia Chest x-ray showed small right pleural effusion with patchy right basilar airspace opacity, atelectasis versus pneumonia Right knee x-ray showed no acute findings  Patient was started on IV antibiotics, breathing treatment, antitussives lidocaine patch pain management IV fluids and admitted for acute respiratory failure with hypoxia in the setting of rib fracture, pneumonia/consolidation/?contusion, left ulna fracture right knee pain and swelling, AKI, thalamic mass       Subjective: Seen examined thsi am Currently doing well on 2 lNC C/o chest pain on left ribs when coughing Co swelling and pain on Left arm and also redness pain and swelling on rt knee  Overnight patient afebrile blood pressure 120s to 150s, saturating 90 to 96% on nasal cannula Labs reviewed potassium 3.2 creatinine 1.4 CBC reviewed from 4/14  Assessment and Plan: Principal Problem:   CAP (community acquired pneumonia) Active Problems:   Left rib fracture   Left ulnar fracture   AKI (acute kidney injury)   Pain and swelling of right knee   Dehydration   Hypoalbuminemia due to protein-calorie malnutrition   Thalamic mass   History of atrial fibrillation   Essential tremor   Depression   Right leg swelling    Left distal ulna fracture: Seen by orthopedics.  Advised to continue splint, NWB, and follow-up with Dr. Orlan Leavens at discharge.  Continue pain control  Right knee pain suspect due to traumatic bursitis now resolving WBAT, continue pain management  Acute respiratory failure with hypoxia in the setting of pneumonia Community-acquired pneumonia Left rib fractures at multiple  sites: Hypoxia in the setting of left rib fractures, pneumonia.  Continue current ceftriaxone azithromycin, bronchodilators.  COVID screen  was negative.  Continue pain management for rib fractures along with Mucinex incentive spirometry flutter valve and supplemental oxygen. Follow-up antigen studies cultures as available. On-call surgeon Dr. Corliss Skains was consulted and stated patient does not need trauma admit at this time since fall was about a week ago and it was only rib fractures.  Concern for dysphagia/aspiration but patient does not want to go to through speech evaluation  Acute kidney injury Dehydration: Manage gentle IV fluid hydration monitor renal function closely avoid nephrotoxic medic medication Recent Labs    07/21/22 1155 07/22/22 0200  BUN 21 19  CREATININE 1.39* 1.46*    Hypoalbuminemia possibly secondary to mild protein caloric malnutrition Albumin 3.3, protein supplement will be provided. RD consulted   Thalamic mass Right thalamic mass (chronic) showed increase in size relative to 2022 and is currently at 3.0 cm:  Neurologist (Dr. Selina Cooley) was consulted and recommended MRI brain with and without contrast to ensure no underlying malignancy or concerning findings.  When discussed with the patient she does not want to proceed any further workup due to her age   RLE swelling more than left: Duplex studies was negative for DVT.   Chronic atrial fibrillation rate controlled.  Change Lovenox to home Eliquis  Depression:Continue venlafaxine   Essential tremor:stable  Goals of care: Currently DNR patient wishes to be comfortable does not want to pursue any workup or MRI brain, palliative care has been consulted.  She also refused to undergo a speech evaluation  DVT prophylaxis: SCDs Start: 07/21/22 1625 Code Status:   Code Status: DNR Family Communication: plan of care discussed with patient at bedside. Patient status is:  inpatient  because of respiratory failure for fractures Level of care: Med-Surg   Dispo: The patient is from: ALF            Anticipated disposition: TBD  Objective: Vitals last 24  hrs: Vitals:   07/21/22 1900 07/21/22 2021 07/22/22 0519 07/22/22 0728  BP:  (!) 152/61 (!) 127/58 120/75  Pulse:  63 61 82  Resp:  20 20   Temp:  98.1 F (36.7 C) 98.5 F (36.9 C) 98.2 F (36.8 C)  TempSrc:  Oral Oral Oral  SpO2:   96% 92%  Weight: 60.8 kg     Height: 5\' 7"  (1.702 m)      Weight change:   Physical Examination: General exam: alert awake, older than stated age HEENT:Oral mucosa moist, Ear/Nose WNL grossly Respiratory system: bilaterally diminished BS, no use of accessory muscle Cardiovascular system: S1 & S2 +, No JVD. Gastrointestinal system: Abdomen soft,NT,ND, BS+ Nervous System:Alert, awake, moving extremities. Extremities: LE edema NEG,distal peripheral pulses palpable.  Skin: No rashes,no icterus. MSK: Normal muscle bulk,tone, power  Medications reviewed:  Scheduled Meds:  dextromethorphan-guaiFENesin  1 tablet Oral BID   enoxaparin (LOVENOX) injection  60 mg Subcutaneous Q24H   feeding supplement (GLUCERNA SHAKE)  237 mL Oral TID BM   lidocaine  1 patch Transdermal Q24H   venlafaxine XR  75 mg Oral Daily  Continuous Infusions:  sodium chloride     azithromycin     cefTRIAXone (ROCEPHIN)  IV     potassium chloride      Diet Order             Diet Heart Room service appropriate? Yes; Fluid consistency: Thin  Diet effective now  Intake/Output Summary (Last 24 hours) at 07/22/2022 1050 Last data filed at 07/22/2022 0700 Gross per 24 hour  Intake --  Output 400 ml  Net -400 ml   Net IO Since Admission: -400 mL [07/22/22 1050]  Wt Readings from Last 3 Encounters:  07/21/22 60.8 kg  11/30/20 82 kg  07/04/20 72.6 kg    Unresulted Labs (From admission, onward)     Start     Ordered   07/23/22 0500  Basic metabolic panel  Daily,   R      07/22/22 0858   07/23/22 0500  CBC  Daily,   R      07/22/22 0858   07/22/22 0500  Basic metabolic panel  Daily,   R      07/21/22 1955          Data Reviewed: I have personally  reviewed following labs and imaging studies CBC: Recent Labs  Lab 07/21/22 1155 07/22/22 0200  WBC 11.5* 8.8  HGB 8.8* 8.6*  HCT 29.3* 27.2*  MCV 87.7 85.3  PLT 202 180   Basic Metabolic Panel: Recent Labs  Lab 07/21/22 1155 07/22/22 0200  NA 134* 134*  K 3.5 3.2*  CL 98 100  CO2 25 24  GLUCOSE 157* 93  BUN 21 19  CREATININE 1.39* 1.46*  CALCIUM 8.8* 8.8*  ZHY:QMVHQIONG Creatinine Clearance: 24.6 mL/min (A) (by C-G formula based on SCr of 1.46 mg/dL (H)). Liver Function Tests: Recent Labs  Lab 07/21/22 1155  AST 19  ALT 11  ALKPHOS 105  BILITOT 0.6  PROT 7.1  ALBUMIN 3.3*  Antimicrobials: Anti-infectives (From admission, onward)    Start     Dose/Rate Route Frequency Ordered Stop   07/22/22 1400  cefTRIAXone (ROCEPHIN) 2 g in sodium chloride 0.9 % 100 mL IVPB        2 g 200 mL/hr over 30 Minutes Intravenous Every 24 hours 07/21/22 1629 07/27/22 1359   07/22/22 1400  azithromycin (ZITHROMAX) 500 mg in sodium chloride 0.9 % 250 mL IVPB        500 mg 250 mL/hr over 60 Minutes Intravenous Every 24 hours 07/21/22 1629 07/27/22 1359   07/21/22 1245  cefTRIAXone (ROCEPHIN) 1 g in sodium chloride 0.9 % 100 mL IVPB        1 g 200 mL/hr over 30 Minutes Intravenous  Once 07/21/22 1241 07/21/22 1632   07/21/22 1245  azithromycin (ZITHROMAX) 500 mg in sodium chloride 0.9 % 250 mL IVPB        500 mg 250 mL/hr over 60 Minutes Intravenous  Once 07/21/22 1241 07/21/22 1632      Culture/Microbiology No results found for: "SDES", "SPECREQUEST", "CULT", "REPTSTATUS"  Radiology Studies: CT Head Wo Contrast  Result Date: 07/21/2022 CLINICAL DATA:  unclear if falls, on eliquis, diffuse body pain EXAM: CT HEAD WITHOUT CONTRAST CT CERVICAL SPINE WITHOUT CONTRAST TECHNIQUE: Multidetector CT imaging of the head and cervical spine was performed following the standard protocol without intravenous contrast. Multiplanar CT image reconstructions of the cervical spine were also generated.  RADIATION DOSE REDUCTION: This exam was performed according to the departmental dose-optimization program which includes automated exposure control, adjustment of the mA and/or kV according to patient size and/or use of iterative reconstruction technique. COMPARISON:  CT head 11/30/2020. FINDINGS: CT HEAD FINDINGS Brain: Discrete cystic area in the right thalamus has increased in size relative to 2022 prior and now measures 3.0 cm with some regional mass effect. Patchy white matter hypodensities are nonspecific but compatible  with chronic microvascular ischemic disease. No evidence of acute large vascular territory infarct. No evidence of acute hemorrhage. No hydrocephalus. Vascular: Calcific atherosclerosis. Skull: No acute fracture.  High left scalp contusion. Sinuses/Orbits: Clear sinuses.  No acute orbital findings. Other: Small left mastoid effusion. CT CERVICAL SPINE FINDINGS Alignment: Mild anterolisthesis of C3 on C4 and C4 on C5, likely degenerative given facet arthropathy at these levels. Otherwise, no substantial sagittal subluxation. Skull base and vertebrae: No evidence of acute fracture. Vertebral body heights are maintained. Osteopenia. Soft tissues and spinal canal: No prevertebral fluid or swelling. No visible canal hematoma. Disc levels: Severe multilevel degenerative change including facet and uncovertebral hypertrophy with varying degrees of neural foraminal stenosis. Upper chest: Please see concurrent CT of the chest for intrathoracic evaluation. Other: Chronically enlarged thyroid. IMPRESSION: CTA head: 1. Discrete cystic area in the right thalamus has increased in size relative to 2022 prior and now measures 3.0 cm with some regional mass effect. Findings are atypical for cystic encephalomalacia and recommend MRI with contrast to further evaluate and exclude malignancy or enhancement. 2. No evidence of acute hemorrhage. 3. Chronic microvascular ischemic change. CT cervical spine: No evidence  of acute fracture or traumatic malalignment. Electronically Signed   By: Feliberto Harts M.D.   On: 07/21/2022 14:39   CT Cervical Spine Wo Contrast  Result Date: 07/21/2022 CLINICAL DATA:  unclear if falls, on eliquis, diffuse body pain EXAM: CT HEAD WITHOUT CONTRAST CT CERVICAL SPINE WITHOUT CONTRAST TECHNIQUE: Multidetector CT imaging of the head and cervical spine was performed following the standard protocol without intravenous contrast. Multiplanar CT image reconstructions of the cervical spine were also generated. RADIATION DOSE REDUCTION: This exam was performed according to the departmental dose-optimization program which includes automated exposure control, adjustment of the mA and/or kV according to patient size and/or use of iterative reconstruction technique. COMPARISON:  CT head 11/30/2020. FINDINGS: CT HEAD FINDINGS Brain: Discrete cystic area in the right thalamus has increased in size relative to 2022 prior and now measures 3.0 cm with some regional mass effect. Patchy white matter hypodensities are nonspecific but compatible with chronic microvascular ischemic disease. No evidence of acute large vascular territory infarct. No evidence of acute hemorrhage. No hydrocephalus. Vascular: Calcific atherosclerosis. Skull: No acute fracture.  High left scalp contusion. Sinuses/Orbits: Clear sinuses.  No acute orbital findings. Other: Small left mastoid effusion. CT CERVICAL SPINE FINDINGS Alignment: Mild anterolisthesis of C3 on C4 and C4 on C5, likely degenerative given facet arthropathy at these levels. Otherwise, no substantial sagittal subluxation. Skull base and vertebrae: No evidence of acute fracture. Vertebral body heights are maintained. Osteopenia. Soft tissues and spinal canal: No prevertebral fluid or swelling. No visible canal hematoma. Disc levels: Severe multilevel degenerative change including facet and uncovertebral hypertrophy with varying degrees of neural foraminal stenosis. Upper  chest: Please see concurrent CT of the chest for intrathoracic evaluation. Other: Chronically enlarged thyroid. IMPRESSION: CTA head: 1. Discrete cystic area in the right thalamus has increased in size relative to 2022 prior and now measures 3.0 cm with some regional mass effect. Findings are atypical for cystic encephalomalacia and recommend MRI with contrast to further evaluate and exclude malignancy or enhancement. 2. No evidence of acute hemorrhage. 3. Chronic microvascular ischemic change. CT cervical spine: No evidence of acute fracture or traumatic malalignment. Electronically Signed   By: Feliberto Harts M.D.   On: 07/21/2022 14:39   CT CHEST ABDOMEN PELVIS WO CONTRAST  Result Date: 07/21/2022 CLINICAL DATA:  Several falls this week.  Right-sided chest and abdominal pain. EXAM: CT CHEST, ABDOMEN AND PELVIS WITHOUT CONTRAST TECHNIQUE: Multidetector CT imaging of the chest, abdomen and pelvis was performed following the standard protocol without IV contrast. RADIATION DOSE REDUCTION: This exam was performed according to the departmental dose-optimization program which includes automated exposure control, adjustment of the mA and/or kV according to patient size and/or use of iterative reconstruction technique. COMPARISON:  Chest CTA on 05/30/2022 FINDINGS: CT CHEST FINDINGS Cardiovascular: No evidence of mediastinal hematoma. No pericardial effusion. Aortic and coronary atherosclerotic calcification incidentally noted. Mediastinum/Nodes: No evidence of hemorrhage or pneumomediastinum. Stable multinodular goiter. No pathologically enlarged lymph nodes identified on this noncontrast exam. Lungs/Pleura: Small right and tiny left pleural effusion show no significant change compared to prior exam. No evidence of pneumothorax. Right lower lobe atelectasis and scarring is again seen. New focal area of airspace opacity with air bronchograms is seen in the anterior right lower lobe, which may be due to pulmonary  contusion or pneumonia. Stable elevation of right hemidiaphragm. Musculoskeletal: Acute, mildly displaced fractures are seen involving the left anterior 3rd through 6th ribs. Old compression fracture of the T11 vertebral body is unchanged in appearance. CT ABDOMEN PELVIS FINDINGS Hepatobiliary: No hepatic parenchymal injury or mass identified on this noncontrast exam. Layering sludge or tiny gallstones again noted. No evidence of cholecystitis or biliary ductal dilatation. Pancreas: No parenchymal abnormality identified on this noncontrast exam. Spleen: No evidence of parenchymal injury on this noncontrast exam. Adrenal/Urinary Tract: No hemorrhage or parenchymal injury identified on this noncontrast exam. Stable bilateral renal parenchymal scarring. No evidence of hydronephrosis. Distended urinary bladder noted. Stomach/Bowel: Moderate hiatal hernia again seen. No evidence of hemoperitoneum or free intraperitoneal air. Otherwise unremarkable. Vascular/Lymphatic: No evidence of retroperitoneal hemorrhage. Aortic atherosclerotic calcification incidentally noted. No pathologically enlarged lymph nodes identified. Reproductive: Prior hysterectomy noted. Adnexal regions are unremarkable in appearance. Other:  None. Musculoskeletal: No acute fractures or suspicious bone lesions identified. IMPRESSION: Acute, mildly displaced fractures involving the left anterior 3rd through 6th ribs. Evidence of pneumothorax. New focal airspace opacity in the anterior right lower lobe, which may be due to pulmonary contusion or pneumonia. Stable small right and tiny left pleural effusions. No evidence of traumatic injury within the abdomen or pelvis. Stable moderate hiatal hernia. Cholelithiasis. No radiographic evidence of cholecystitis. Aortic Atherosclerosis (ICD10-I70.0). Electronically Signed   By: Danae Orleans M.D.   On: 07/21/2022 14:37   DG Knee 2 Views Right  Result Date: 07/21/2022 CLINICAL DATA:  Fall last week.  Right  knee pain. EXAM: RIGHT KNEE - 2 VIEW COMPARISON:  07/02/2020 FINDINGS: No evidence of fracture, dislocation, or joint effusion. Enthesopathic changes are seen involving the patella. No other changes of arthropathy are seen. Extensive peripheral vascular calcification noted. IMPRESSION: No acute findings. Electronically Signed   By: Danae Orleans M.D.   On: 07/21/2022 13:22   DG Forearm Left  Result Date: 07/21/2022 CLINICAL DATA:  Right knee and left arm pain status post fall EXAM: LEFT FOREARM - 2 VIEW COMPARISON:  None Available. FINDINGS: The bones appear diffusely osteopenic. There is an acute, obliquely oriented fracture deformity involving the distal diaphysis of the ulna. No additional fractures identified. Degenerative changes are identified at the basilar joint and radiocarpal joint. Chondrocalcinosis. IMPRESSION: 1. Acute, obliquely oriented fracture deformity involving the distal diaphysis of the ulna. 2. Osteopenia. Electronically Signed   By: Signa Kell M.D.   On: 07/21/2022 13:20   DG Chest 2 View  Result Date: 07/21/2022 CLINICAL DATA:  No chest  pain after fall EXAM: CHEST - 2 VIEW COMPARISON:  05/30/2022 FINDINGS: Stable cardiomegaly. Aortic atherosclerosis. Small right pleural effusion with patchy right basilar airspace opacity. Left lung appears clear. No pneumothorax. Bones are demineralized. No definite fracture. IMPRESSION: Small right pleural effusion with patchy right basilar airspace opacity, atelectasis versus pneumonia. Electronically Signed   By: Duanne Guess D.O.   On: 07/21/2022 12:26     LOS: 1 day  Lanae Boast, MD [facility]  07/22/2022, 10:50 AM

## 2022-07-22 NOTE — Plan of Care (Signed)

## 2022-07-22 NOTE — TOC Initial Note (Signed)
Transition of Care [facility]) - Initial/Assessment Note    Patient Details  Name: [name] MRN: [id number] Date of Birth: Oct 31, 1929  [facility]) CM/SW Contact:    Lockie Pares, RN Phone Number: 07/22/2022, 1:53 PM  Clinical Narrative:                 Patient presents from ALF with a fall that was a week ago. She has rib fracture, ulnar fx and pneumonia. PT and OT have seen her, expect to go to ALF with support.  Currently on IV abx TOC will follow    Barriers to Discharge: Continued Medical Work up   Patient Goals and CMS Choice            Expected Discharge Plan and Services       Living arrangements for the past 2 months: Assisted Living Facility                                      Prior Living Arrangements/Services Living arrangements for the past 2 months: Assisted Living Facility Lives with:: Self Patient language and need for interpreter reviewed:: Yes        Need for Family Participation in Patient Care: Yes (Comment) Care giver support system in place?: Yes (comment)   Criminal Activity/Legal Involvement Pertinent to Current Situation/Hospitalization: No - Comment as needed  Activities of Daily Living      Permission Sought/Granted                  Emotional Assessment       Orientation: : Oriented to Self, Oriented to Place, Oriented to Situation Alcohol / Substance Use: Not Applicable Psych Involvement: No (comment)  Admission diagnosis:  Aspiration pneumonia [J69.0] Pleural effusion on right [J90] Closed fracture of multiple ribs of left side, initial encounter [S22.42XA] Chest pain, unspecified type [R07.9] Aspiration pneumonia of right lower lobe, unspecified aspiration pneumonia type [J69.0] Patient Active Problem List   Diagnosis Date Noted   CAP (community acquired pneumonia) 07/21/2022   Left rib fracture 07/21/2022   Left ulnar fracture 07/21/2022   AKI (acute kidney injury) 07/21/2022   Pain  and swelling of right knee 07/21/2022   Dehydration 07/21/2022   Hypoalbuminemia due to protein-calorie malnutrition 07/21/2022   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   History of atrial fibrillation 07/21/2022   Essential tremor 07/21/2022   Depression 07/21/2022   Right leg swelling 07/21/2022   PCP:  Joycelyn Man, NP Pharmacy:   [id number] [place], [place] - [address] AT [facility] OF [facility] & [address] [place] [address] Phone: [phone] Fax: [phone]     Social Determinants of Health (SDOH) Social History:   SDOH Interventions:     Readmission Risk Interventions     No data to display

## 2022-07-22 NOTE — Plan of Care (Signed)
  Problem: Education: Goal: Knowledge of General Education information will improve Description: Including pain rating scale, medication(s)/side effects and non-pharmacologic comfort measures Outcome: Progressing   Problem: Health Behavior/Discharge Planning: Goal: Ability to manage health-related needs will improve 07/22/2022 0649 by Aggie Moats, RN Outcome: Progressing 07/22/2022 0631 by Vara Guardian Lyn A, RN Outcome: Progressing   Problem: Clinical Measurements: Goal: Ability to maintain clinical measurements within normal limits will improve Outcome: Progressing   Problem: Coping: Goal: Level of anxiety will decrease Outcome: Progressing   Problem: Elimination: Goal: Will not experience complications related to bowel motility Outcome: Progressing   Problem: Pain Managment: Goal: General experience of comfort will improve Outcome: Progressing

## 2022-07-22 NOTE — TOC Initial Note (Signed)
Transition of Care [facility]) - Initial/Assessment Note    Patient Details  Name: [name] MRN: [id number] Date of Birth: March 21, 1930  Transition of [facility]) CM/SW Contact:    Lorri Frederick, LCSW Phone Number: 07/22/2022, 3:43 PM  Clinical Narrative:   CSW spoke with pt regarding DC recommendation for SNF.  Pt oriented x3, was able to participate in conversation, unclear level of retention.  Pt able to identify that she lives at [facility], permission given to speak with grandchildren.  We discussed SNF rehab and she said she would have to think about it.  CSW spoke with granddaughter Burnetta Sabin, pt full time in wheelchair, not always willing to participate when they attempt PT at [facility].  She will discuss with her brother.    CSW spoke with Vernona Rieger, Charity fundraiser at [facility].  They could accept pt back without rehab if that was the family decision.  CSW spoke with grandson Air cabin crew.  Discussed SNF vs return to [facility].  He does want to pursue SNF and is planning to talk with pt about need to work harder with PT.                  Expected Discharge Plan: Skilled Nursing Facility Barriers to Discharge: SNF Pending bed offer   Patient Goals and CMS Choice            Expected Discharge Plan and Services In-house Referral: Clinical Social Work   Post Acute Care Choice:  (TBD) Living arrangements for the past 2 months:  ([facility])                                      Prior Living Arrangements/Services Living arrangements for the past 2 months:  ([facility] [employer]) Lives with:: Facility Resident Patient language and need for interpreter reviewed:: Yes        Need for Family Participation in Patient Care: Yes (Comment) Care giver support system in place?: Yes (comment) Current home services: Home PT Criminal Activity/Legal Involvement Pertinent to Current Situation/Hospitalization: No - Comment as needed  Activities of Daily Living      Permission  Sought/Granted Permission sought to share information with : Family Supports Permission granted to share information with : Yes, Verbal Permission Granted  Share Information with NAME: grandson Susy Frizzle, granddaughter Medical illustrator           Emotional Assessment Appearance:: Appears stated age Attitude/Demeanor/Rapport: Engaged Affect (typically observed): Appropriate Orientation: : Oriented to Self, Oriented to Place, Oriented to Situation Alcohol / Substance Use: Not Applicable Psych Involvement: No (comment)  Admission diagnosis:  Aspiration pneumonia [J69.0] Pleural effusion on right [J90] Closed fracture of multiple ribs of left side, initial encounter [S22.42XA] Chest pain, unspecified type [R07.9] Aspiration pneumonia of right lower lobe, unspecified aspiration pneumonia type [J69.0] Patient Active Problem List   Diagnosis Date Noted   CAP (community acquired pneumonia) 07/21/2022   Left rib fracture 07/21/2022   Left ulnar fracture 07/21/2022   AKI (acute kidney injury) 07/21/2022   Pain and swelling of right knee 07/21/2022   Dehydration 07/21/2022   Hypoalbuminemia due to protein-calorie malnutrition 07/21/2022   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 [place] [address] Phone: [phone] Fax: [phone]     Social Determinants of Health (SDOH) Social History:   SDOH Interventions:     Readmission Risk Interventions     No data to display

## 2022-07-22 NOTE — Consult Note (Signed)
Consultation Note Date: 07/22/2022   Patient Name: [name]  DOB: 05/29/1929  MRN: [id number]  Age / Sex: 89 y.o., female  PCP: Anna Man, NP Referring Physician: Lanae Boast, MD  Reason for Consultation: Establishing goals of care  HPI/Patient Profile: 89 y.o. female  with past medical history of paroxysmal atrial fibrillation on Eliquis, essential tremor, rheumatoid arthritis, anxiety, depression admitted from [facility] on 07/21/2022 with left-sided chest pain following a fall ~1 week ago now worsening with movement. Also with increasing shortness of breath, nonproductive cough and found to have community acquired pneumonia, left rib fracture, left ulna fracture, right knee swelling, acute kidney injury, increased size right thalamic mass. Rule out RLE DVT.   Clinical Assessment and Goals of Care: Consult received and chart review completed. I met today with [name] at bedside. No family or visitors present. She is awake and alert and able to communicate. She confirms that she has a grandson, [name], that visits her weekly and a granddaughter also. She reports that [name] was here to visit her yesterday and is aware of her situation and condition. [name] confirms her desire for no MRI - she tells me that her sister had a similar issue and similar request for no work up. [name] and I discuss her goals of care and she does indicate a desire to "improve" and "get better" but also tells me that her comfort and quality of life is priority. We discuss her swallowing issues and she desires regular diet. She tells me that she always coughs bad and this is not unusual for her. She tells me that she had Covid and pneumonia a few months ago and she has had more issues since then. She is not inclined for SNF rehab but we are unclear if she can get the care she receives back at [facility] given her  injuries.   I called and spoke with grandson, [name]. I shared my conversation with his grandmother and he is not surprised. He reports that she can be stubborn at times. He expresses similar history with mostly wheelchair bound but able to toilet and maneuver herself from wheelchair fairly independently. She has been receiving more meals in her room. He knows with her ulnar fracture she will not be able to maneuver her wheelchair. He is in favor of SNF rehab and will speak with [place] about this when he visits tonight. He expresses concern that she may need a higher level of care overall. He will speak with her about SNF rehab as a start. I discussed with [name] the option of hospice added to care at [facility] if she is focused more on comfort and quality of life and not wanting further work up for medical issues. [name] is open to this option but does report that he had hospice for his grandfather and they were not responsive or all that helpful.   All questions/concerns addressed. Emotional support provided.   Primary Decision Maker PATIENT    SUMMARY OF RECOMMENDATIONS   - DNR -  Regular diet, NO MRI - Grandson hoping he can talk her into SNF rehab - Continue conservative treatments  Code Status/Advance Care Planning: DNR   Symptom Management:  Per attending.    Prognosis:  Overall prognosis poor with decline and more health complications over the past few months.   Discharge Planning: To Be Determined      Primary Diagnoses: Present on Admission: **None**   I have reviewed the medical record, interviewed the patient and family, and examined the patient. The following aspects are pertinent.  Past Medical History:  Diagnosis Date   Rheumatoid arthritis    Social History   Socioeconomic History   Marital status: Married    Spouse name: Not on file   Number of children: Not on file   Years of education: Not on file   Highest education level: Not on file  Occupational  History   Not on file  Tobacco Use   Smoking status: Not on file   Smokeless tobacco: Not on file  Substance and Sexual Activity   Alcohol use: Not on file   Drug use: Not on file   Sexual activity: Not on file  Other Topics Concern   Not on file  Social History Narrative   ** Merged History Encounter **       Social Determinants of Health   Financial Resource Strain: Not on file  Food Insecurity: Not on file  Transportation Needs: Not on file  Physical Activity: Not on file  Stress: Not on file  Social Connections: Not on file   History reviewed. No pertinent family history. Scheduled Meds:  dextromethorphan-guaiFENesin  1 tablet Oral BID   enoxaparin (LOVENOX) injection  60 mg Subcutaneous Q24H   feeding supplement (GLUCERNA SHAKE)  237 mL Oral TID BM   lidocaine  1 patch Transdermal Q24H   venlafaxine XR  75 mg Oral Daily   Continuous Infusions:  sodium chloride     azithromycin     cefTRIAXone (ROCEPHIN)  IV     potassium chloride     PRN Meds:.acetaminophen **OR** acetaminophen, ipratropium-albuterol, ondansetron **OR** ondansetron (ZOFRAN) IV, oxyCODONE-acetaminophen Allergies  Allergen Reactions   Naproxen     Other reaction(s): HIVES   Penicillins     unknown   Prozac [Fluoxetine]     Other reaction(s): HIVES   Review of Systems  Constitutional:  Positive for activity change, appetite change and fatigue.  Respiratory:  Positive for cough.     Physical Exam Vitals and nursing note reviewed.  Constitutional:      General: She is not in acute distress. Cardiovascular:     Rate and Rhythm: Normal rate.  Pulmonary:     Effort: No tachypnea, accessory muscle usage or respiratory distress.     Comments: Nonproductive cough Abdominal:     General: Abdomen is flat.     Palpations: Abdomen is soft.  Neurological:     Mental Status: She is alert and oriented to person, place, and time.     Vital Signs: BP 120/75 (BP Location: Right Arm)   Pulse 82    Temp 98.2 F (36.8 C) (Oral)   Resp 20   Ht 5\' 7"  (1.702 m)   Wt 60.8 kg   SpO2 92%   BMI 20.99 kg/m  Pain Scale: 0-10 POSS *See Group Information*: S-Acceptable,Sleep, easy to arouse Pain Score: Asleep   SpO2: SpO2: 92 % O2 Device:SpO2: 92 % O2 Flow Rate: .O2 Flow Rate (L/min): 2 L/min  IO: Intake/output summary:  Intake/Output  Summary (Last 24 hours) at 07/22/2022 1050 Last data filed at 07/22/2022 0700 Gross per 24 hour  Intake --  Output 400 ml  Net -400 ml    LBM:   Baseline Weight: Weight: 60.8 kg Most recent weight: Weight: 60.8 kg     Palliative Assessment/Data:     Time In: 1150  Time Total: 75 min  Greater than 50%  of this time was spent counseling and coordinating care related to the above assessment and plan.  Signed by: Yong Channel, NP Palliative Medicine Team Pager # [phone] (M-F 8a-5p) Team Phone # [phone] (Nights/Weekends)

## 2022-07-22 NOTE — Evaluation (Signed)
Occupational Therapy Evaluation   Patient Details Name: [name] MRN: [id number] DOB: [birth year] Today's Date: 07/22/2022   History of Present Illness 89 y.o. female with medical history significant of anxiety, depression, essential tremor, paroxysmal atrial fibrillation on Eliquis who presents to the emergency department from [facility] due to complete left-sided chest pain.  Patient states that she had a fall about a week ago with left-sided chest pain and left hand pain that worsens on movement.  Patient with L forearm fx and L rib fractures.   Clinical Impression   Patient admitted for the diagnosis above.  PTA she lives at a local ALF, uses a wheelchair in her room and throughout the facility, and states she is needing more help with ADL performance.  Currently she is needing Max A for bed mobility, deferring OOB to the recliner, and Max A for bedlevel ADL.  Patient is not interested in post acute rehab, stating she is "too old and can't do it", so OT will recommend SNF for potential LTC.  Patient states she can't go back to her ALF because they can't help her enough.  OT will try to see her again for OOB and functional status, but if she refuses, would recommend discharge off services.        Recommendations for follow up therapy are one component of a multi-disciplinary discharge planning process, led by the attending physician.  Recommendations may be updated based on patient status, additional functional criteria and insurance authorization.   Assistance Recommended at Discharge Frequent or constant Supervision/Assistance  Patient can return home with the following Help with stairs or ramp for entrance;A lot of help with bathing/dressing/bathroom;A lot of help with walking and/or transfers;Direct supervision/assist for medications management;Assist for transportation;Assistance with cooking/housework    Functional Status Assessment  Patient has had a recent decline  in their functional status and demonstrates the ability to make significant improvements in function in a reasonable and predictable amount of time.  Equipment Recommendations  None recommended by OT    Recommendations for Other Services       Precautions / Restrictions Precautions Precautions: Fall Required Braces or Orthoses: Splint/Cast Splint/Cast: L forearm based cast with ACE wrap Restrictions Weight Bearing Restrictions: No      Mobility Bed Mobility Overal bed mobility: Needs Assistance Bed Mobility: Supine to Sit, Sit to Supine     Supine to sit: Max assist Sit to supine: Max assist     Patient Response: Cooperative  Transfers                   General transfer comment: deferred out of bed to the recliner      Balance Overall balance assessment: Needs assistance Sitting-balance support: Feet supported, Bilateral upper extremity supported Sitting balance-Leahy Scale: Fair   Postural control: Right lateral lean                                 ADL either performed or assessed with clinical judgement   ADL Overall ADL's : Needs assistance/impaired Eating/Feeding: Set up;Bed level   Grooming: Set up;Bed level   Upper Body Bathing: Moderate assistance;Bed level   Lower Body Bathing: Maximal assistance;Bed level   Upper Body Dressing : Moderate assistance;Bed level   Lower Body Dressing: Maximal assistance;Bed level   Toilet Transfer: Maximal assistance;BSC/3in1;Squat-pivot  Vision Patient Visual Report: No change from baseline       Perception     Praxis      Pertinent Vitals/Pain Pain Assessment Pain Assessment: Faces Faces Pain Scale: Hurts even more Pain Location: L ribs with cough Pain Descriptors / Indicators: Grimacing, Guarding, Sharp Pain Intervention(s): Monitored during session     Hand Dominance Right   Extremity/Trunk Assessment Upper Extremity Assessment Upper Extremity  Assessment: Generalized weakness;LUE deficits/detail LUE Deficits / Details: L splint in place blocking wrist flexion and supination.  Hand is swollen. LUE Sensation: WNL LUE Coordination: WNL   Lower Extremity Assessment Lower Extremity Assessment: Defer to PT evaluation   Cervical / Trunk Assessment Cervical / Trunk Assessment: Kyphotic   Communication Communication Communication: No difficulties;HOH   Cognition Arousal/Alertness: Awake/alert Behavior During Therapy: WFL for tasks assessed/performed Overall Cognitive Status: Within Functional Limits for tasks assessed                                       General Comments   VSS on RA    Exercises     Shoulder Instructions      Home Living Family/patient expects to be discharged to:: Assisted living                             Home Equipment: Rolling Walker (2 wheels);Wheelchair - manual          Prior Functioning/Environment Prior Level of Function : Needs assist             Mobility Comments: Now using wheelchair in her room and to the [employer]. ADLs Comments: States she needs assist with ADL and medications.        OT Problem List: Decreased strength;Decreased range of motion;Decreased activity tolerance;Impaired balance (sitting and/or standing);Pain;Increased edema      OT Treatment/Interventions: Self-care/ADL training;Therapeutic activities;Therapeutic exercise;Patient/family education;DME and/or AE instruction;Balance training    OT Goals(Current goals can be found in the care plan section) Acute Rehab OT Goals Patient Stated Goal: I don't know OT Goal Formulation: With patient Time For Goal Achievement: 08/05/22 Potential to Achieve Goals: Fair ADL Goals Pt Will Perform Grooming: with set-up;sitting Pt Will Perform Upper Body Bathing: with min assist;sitting Pt Will Perform Lower Body Bathing: with mod assist;sit to/from stand Pt Will Perform Upper Body  Dressing: with set-up;sitting Pt Will Perform Lower Body Dressing: with mod assist;sit to/from stand Pt Will Transfer to Toilet: with min assist;stand pivot transfer;bedside commode  OT Frequency: Min 1X/week    Co-evaluation              AM-PAC OT "6 Clicks" Daily Activity     Outcome Measure Help from another person eating meals?: A Little Help from another person taking care of personal grooming?: A Little Help from another person toileting, which includes using toliet, bedpan, or urinal?: A Lot Help from another person bathing (including washing, rinsing, drying)?: A Lot Help from another person to put on and taking off regular upper body clothing?: A Lot Help from another person to put on and taking off regular lower body clothing?: A Lot 6 Click Score: 14   End of Session Equipment Utilized During Treatment: Oxygen Nurse Communication: Mobility status  Activity Tolerance: Patient limited by pain Patient left: in bed;with call bell/phone within reach;with bed alarm set  OT Visit Diagnosis: Unsteadiness on feet (R26.81);Muscle  weakness (generalized) (M62.81);History of falling (Z91.81);Pain Pain - Right/Left: Left Pain - part of body: Arm                Time: 9450-3888 OT Time Calculation (min): 19 min Charges:  OT General Charges $OT Visit: 1 Visit OT Evaluation $OT Eval Moderate Complexity: 1 Mod  07/22/2022  RP, OTR/L  Acute Rehabilitation Services  Office:  [phone]   Suzanna Obey 07/22/2022, 9:38 AM

## 2022-07-22 NOTE — Evaluation (Signed)
Clinical/Bedside Swallow Evaluation Patient Details  Name: [name] MRN: [id number] Date of Birth: 1929-09-17  Today's Date: 07/22/2022 Time: SLP Start Time (ACUTE ONLY): 0940 SLP Stop Time (ACUTE ONLY): 1000 SLP Time Calculation (min) (ACUTE ONLY): 20 min  Past Medical History:  Past Medical History:  Diagnosis Date   Rheumatoid arthritis    Past Surgical History: History reviewed. No pertinent surgical history. HPI:  89 y.o. female with medical history significant of anxiety, depression, essential tremor, paroxysmal atrial fibrillation on Eliquis who presents to the emergency department from [facility] due to complete left-sided chest pain.  Patient states that she had a fall about a week ago with left-sided chest pain and left hand pain that worsens on movement.  Patient with L forearm fx and L rib fractures.    Assessment / Plan / Recommendation  Clinical Impression  Pt has reportedly been observed to cough a great deal with her meal. She reports this is common for her and coughing is exceptionally uncomfortable given her broken ribs. However when SLP questioned her more about the coughing and struggle with chewing given lack of dentition she asked "what are you going to do about it?" Responded that softening her food might make it easier for her to chew and swallow with less coughing. She replied "Yeah but I might not like that." When asked if she would rather have soft food or cough pt replied coughing is ok and enjoying her food is important to her. She also reported it would be ok if she choked given that she is 89 years old. She is not in favor of any further testing or SLP intervention. Would like to continue current diet. MD and RN aware. SLP Visit Diagnosis: Dysphagia, oral phase (R13.11)    Aspiration Risk  Moderate aspiration risk    Diet Recommendation Regular;Thin liquid   Liquid Administration via: Cup;Straw Medication Administration: Other (Comment)  (as tolerated per pt - may benefit from pills in puree if she is ok to try that) Supervision: Patient able to self feed    Other  Recommendations Oral Care Recommendations: Oral care BID    Recommendations for follow up therapy are one component of a multi-disciplinary discharge planning process, led by the attending physician.  Recommendations may be updated based on patient status, additional functional criteria and insurance authorization.  Follow up Recommendations No SLP follow up      Assistance Recommended at Discharge    Functional Status Assessment    Frequency and Duration            Prognosis        Swallow Study   General HPI: 89 y.o. female with medical history significant of anxiety, depression, essential tremor, paroxysmal atrial fibrillation on Eliquis who presents to the emergency department from [facility] due to complete left-sided chest pain.  Patient states that she had a fall about a week ago with left-sided chest pain and left hand pain that worsens on movement.  Patient with L forearm fx and L rib fractures. Type of Study: Bedside Swallow Evaluation Previous Swallow Assessment: none Diet Prior to this Study: Regular;Thin liquids (Level 0) Temperature Spikes Noted: No Respiratory Status: Room air History of Recent Intubation: No Behavior/Cognition: Alert;Cooperative;Pleasant mood Oral Cavity Assessment: Within Functional Limits Oral Care Completed by SLP: No Oral Cavity - Dentition: Edentulous;Dentures, top Vision: Impaired for self-feeding Self-Feeding Abilities: Needs assist Patient Positioning: Upright in bed Baseline Vocal Quality: Normal Volitional Cough: Strong Volitional Swallow: Able to  elicit    Oral/Motor/Sensory Function Overall Oral Motor/Sensory Function: Within functional limits   Ice Chips     Thin Liquid Thin Liquid: Within functional limits    Nectar Thick Nectar Thick Liquid: Not tested   Honey Thick Honey Thick  Liquid: Not tested   Puree Puree: Not tested   Solid     Solid: Impaired Presentation: Self Fed Pharyngeal Phase Impairments: Cough - Immediate      Seeley Hissong, Riley Nearing 07/22/2022,10:12 AM

## 2022-07-22 NOTE — Hospital Course (Addendum)
89 y.o.f w/ significant of anxiety, depression, essential tremor, PAF on Eliquis presented to the EDfrom [facility] due to left-sided chest pain/shortness of breaths, reported he had a fall and subsequently having chest pain and left ankle started and has been worsening, also complaining of swelling and redness of right knee and was currently on antibiotics for this. Patient endorsed history of pleural effusions and states that she had drainage of the chest a few weeks ago. She denied fever, chills, palpitations, nausea, vomiting, abdominal pain, blood in stools.     In QM:VHQIONGEXB with respiratory rate in low 20s, pulse 50 bpm, BP 120/49, temperature 98.2 F, O2 sat was 97%.  Workup in the ED showed normocytic anemia, WBC 11.5, MCV 87.7, platelets 202.  BMP was normal sodium of 134, blood glucose 157, creatinine 1.39 (baseline creatinine at 0.8-0.9).  Troponin x 2 was negative.  Albumin 3.3  CT head and CT cervical spine without contrast showed 1. Discrete cystic area in the right thalamus has increased in size relative to 2022 prior and now measures 3.0 cm with some regional mass effect. Findings are atypical for cystic encephalomalacia and recommend MRI with contrast to further evaluate and exclude malignancy or enhancement. 2. No evidence of acute hemorrhage. 3. Chronic microvascular ischemic change.  CT cervical spine:No evidence of acute fracture or traumatic malalignment.  CT chest, abdomen and pelvis without contrast showed: Acute, mildly displaced fractures involving the left anterior 3rd through 6th ribs. No evidence of pneumothorax. New focal airspace opacity in the anterior right lower lobe, which may be due to pulmonary contusion or pneumonia.  No evidence of traumatic injury within the abdomen or pelvis.  Stable moderate hiatal hernia.  Cholelithiasis. No radiographic evidence of cholecystitis.  Left forearm x-ray showed acute, obliquely oriented fracture deformity  involving the distal diaphysis of the ulna.  Osteopenia Chest x-ray showed small right pleural effusion with patchy right basilar airspace opacity, atelectasis versus pneumonia Right knee x-ray showed no acute findings  Patient was started on IV antibiotics, breathing treatment, antitussives lidocaine patch pain management IV fluids and admitted for acute respiratory failure with hypoxia in the setting of rib fracture, pneumonia/consolidation/?contusion, left ulna fracture right knee pain and swelling, AKI, thalamic mass. Patient refused further MRI and workup, palliative care was consulted. Orthopedic advised outpatient hand surgery follow-up At this time she is medically stable, PT OT has recommended skilled nursing facility which patient's family wants to try. She will benefit with hospice care eventually after discharge from SNF  as patient essentially does not want to have any further workup and wants to stay comfortable. Skilled nursing patient has been approved and can be admitted 4/18

## 2022-07-22 NOTE — Evaluation (Signed)
Physical Therapy Evaluation Patient Details Name: [name] MRN: [id number] DOB: [birth year] Today's Date: 07/22/2022  History of Present Illness  Pt is a 89 yo female presenting to [facility] ED from [facility] due to complete L sided chest pain after a fall about 1 week ago with L handp an that worsens on movement. She also has swelling and redness of R knee. She also has increasing shortness of breathe and non-productive cough that has been going on for several days. PMH: anxiety, depression, essential tremor, paroxysmal a-fib, osteopenia.  Clinical Impression  Pt is presenting slightly below baseline. Pt is limited due to WB restrictions on the LUE. Pt was able to stand at Min A from EOB to RW while maintaining WB precautions at the LUE. Pt rested L finger tips on RW with minimal to moderate reliance of RUE on the RW for taking a couple of side steps at Min A up EOB. Pt will require increased assistance on discharge from acute care hospital setting. She is unsure if she would like to participate in skilled physical therapy services. Pt states that she is 89 years old and doing what she is going to be doing. Will continue to follow at this time in acute care hospital setting.        Recommendations for follow up therapy are one component of a multi-disciplinary discharge planning process, led by the attending physician.  Recommendations may be updated based on patient status, additional functional criteria and insurance authorization.  Follow Up Recommendations Can patient physically be transported by private vehicle: No     Assistance Recommended at Discharge Intermittent Supervision/Assistance  Patient can return home with the following  A little help with walking and/or transfers;Assist for transportation;Help with stairs or ramp for entrance;Assistance with cooking/housework    Equipment Recommendations None recommended by PT  Recommendations for Other Services        Functional Status Assessment Patient has had a recent decline in their functional status and demonstrates the ability to make significant improvements in function in a reasonable and predictable amount of time.     Precautions / Restrictions Precautions Precautions: Fall Required Braces or Orthoses: Splint/Cast Splint/Cast: L forearm based cast with ACE wrap Restrictions Weight Bearing Restrictions: Yes LUE Weight Bearing: Non weight bearing RLE Weight Bearing: Weight bearing as tolerated      Mobility  Bed Mobility Overal bed mobility: Needs Assistance Bed Mobility: Supine to Sit, Sit to Supine     Supine to sit: Min guard Sit to supine: Min assist   General bed mobility comments: Pt was Min guard with HOB elevated from supine to sitting and Min A at bil LE for sitting to supine Patient Response: Cooperative  Transfers Overall transfer level: Needs assistance Equipment used: Rolling walker (2 wheels) Transfers: Sit to/from Stand Sit to Stand: Min assist           General transfer comment: Pt performed sit to stand from EOB with verbal cues for correct hand placement and to prevent pushing up with the LUE. Pt was able to push up with RUE and rested finger tips on the L side of RW relying on the RUE for WB    Ambulation/Gait               General Gait Details: Pt was able to take a couple of side steps at EOB with assistance for navigating RW Maybe try hemi walker or quad cane next session. PA states no WB through LUE  Balance Overall balance assessment: Needs assistance Sitting-balance support: Feet supported, Bilateral upper extremity supported Sitting balance-Leahy Scale: Fair     Standing balance support: Single extremity supported, During functional activity, Reliant on assistive device for balance Standing balance-Leahy Scale: Fair Standing balance comment: no overt LOB       Pertinent Vitals/Pain Pain Assessment Pain Assessment:  Faces Faces Pain Scale: Hurts even more Pain Location: L ribs with cough Pain Descriptors / Indicators: Grimacing, Guarding, Sharp Pain Intervention(s): Monitored during session    Home Living Family/patient expects to be discharged to:: Assisted living       Home Equipment: Agricultural consultant (2 wheels);Wheelchair - manual      Prior Function Prior Level of Function : Needs assist   Mobility Comments: Now using wheelchair in her room and to the dining hall. ADLs Comments: States she needs assist with ADL and medications.     Hand Dominance   Dominant Hand: Right    Extremity/Trunk Assessment   Upper Extremity Assessment Upper Extremity Assessment: Defer to OT evaluation LUE Deficits / Details: L splint in place blocking wrist flexion and supination.  Hand is swollen. LUE Sensation: WNL LUE Coordination: WNL    Lower Extremity Assessment Lower Extremity Assessment: Generalized weakness    Cervical / Trunk Assessment Cervical / Trunk Assessment: Kyphotic  Communication   Communication: No difficulties;HOH  Cognition Arousal/Alertness: Awake/alert Behavior During Therapy: WFL for tasks assessed/performed Overall Cognitive Status: Within Functional Limits for tasks assessed          General Comments General comments (skin integrity, edema, etc.): Pt deferred further mobility and stated that she is 89 years old and is where she is at. She states that she is at the age where you are okay with passing away and that she hopes this is her last year. RN notified.        Assessment/Plan    PT Assessment Patient needs continued PT services  PT Problem List Decreased strength;Decreased mobility;Decreased activity tolerance;Decreased balance       PT Treatment Interventions DME instruction;Therapeutic activities;Gait training;Therapeutic exercise;Patient/family education;Stair training;Balance training;Functional mobility training;Neuromuscular re-education    PT Goals  (Current goals can be found in the Care Plan section)  Acute Rehab PT Goals Patient Stated Goal: Pt is currently unable to decide PT Goal Formulation: With patient Time For Goal Achievement: 08/05/22 Potential to Achieve Goals: Fair    Frequency Min 2X/week        AM-PAC PT "6 Clicks" Mobility  Outcome Measure Help needed turning from your back to your side while in a flat bed without using bedrails?: A Little Help needed moving from lying on your back to sitting on the side of a flat bed without using bedrails?: A Little Help needed moving to and from a bed to a chair (including a wheelchair)?: A Little Help needed standing up from a chair using your arms (e.g., wheelchair or bedside chair)?: A Little Help needed to walk in hospital room?: A Lot Help needed climbing 3-5 steps with a railing? : Total 6 Click Score: 15    End of Session Equipment Utilized During Treatment: Other (comment) (sling and spling on LUE; no gait belt due to fractures)   Patient left: in bed;with bed alarm set;with call bell/phone within reach Nurse Communication: Mobility status PT Visit Diagnosis: Unsteadiness on feet (R26.81);Other abnormalities of gait and mobility (R26.89)    Time: 1025-1059 PT Time Calculation (min) (ACUTE ONLY): 34 min   Charges:   PT Evaluation $PT Eval Low  Complexity: 1 Low PT Treatments $Therapeutic Activity: 8-22 mins        Harrel Carina, DPT, CLT  Acute Rehabilitation Services Office: [phone] (Secure chat preferred)   Claudia Desanctis 07/22/2022, 11:46 AM

## 2022-07-22 NOTE — Consult Note (Signed)
Reason for Consult:Left wrist fx and right knee pain Referring Physician: Lanae Boast Time called: 1000 Time at bedside: 1041   [name] is an 89 y.o. female.  HPI: Maryjo fell at home about a week ago and had left hand and right knee pain. She was treated at the ALF where she resides but came to the ED 2/2 undertreated chest pain. X-rays showed a distal ulna fx and hand surgery was consulted. She is RHD.  Past Medical History:  Diagnosis Date   Rheumatoid arthritis     History reviewed. No pertinent surgical history.  History reviewed. No pertinent family history.  Social History:  has no history on file for tobacco use, alcohol use, and drug use.  Allergies:  Allergies  Allergen Reactions   Naproxen     Other reaction(s): HIVES   Penicillins     unknown   Prozac [Fluoxetine]     Other reaction(s): HIVES    Medications: I have reviewed the patient's current medications.  Results for orders placed or performed during the hospital encounter of 07/21/22 (from the past 48 hour(s))  Basic metabolic panel     Status: Abnormal   Collection Time: 07/21/22 11:55 AM  Result Value Ref Range   Sodium 134 (L) 135 - 145 mmol/L   Potassium 3.5 3.5 - 5.1 mmol/L   Chloride 98 98 - 111 mmol/L   CO2 25 22 - 32 mmol/L   Glucose, Bld 157 (H) 70 - 99 mg/dL    Comment: Glucose reference range applies only to samples taken after fasting for at least 8 hours.   BUN 21 8 - 23 mg/dL   Creatinine, Ser 9.62 (H) 0.44 - 1.00 mg/dL   Calcium 8.8 (L) 8.9 - 10.3 mg/dL   GFR, Estimated 36 (L) >60 mL/min    Comment: (NOTE) Calculated using the CKD-EPI Creatinine Equation (2021)    Anion gap 11 5 - 15    Comment: Performed at [facility], [address], [place], [place] [address]  CBC     Status: Abnormal   Collection Time: 07/21/22 11:55 AM  Result Value Ref Range   WBC 11.5 (H) 4.0 - 10.5 K/uL   RBC 3.34 (L) 3.87 - 5.11 MIL/uL   Hemoglobin 8.8 (L) 12.0 - 15.0 g/dL    HCT 13.2 (L) 44.0 - 46.0 %   MCV 87.7 80.0 - 100.0 fL   MCH 26.3 26.0 - 34.0 pg   MCHC 30.0 30.0 - 36.0 g/dL   RDW 10.2 (H) 72.5 - 36.6 %   Platelets 202 150 - 400 K/uL   nRBC 0.0 0.0 - 0.2 %    Comment: Performed at [facility], [address] [place], [place] [address]  Troponin I (High Sensitivity)     Status: None   Collection Time: 07/21/22 11:55 AM  Result Value Ref Range   Troponin I (High Sensitivity) 11 <18 ng/L    Comment: (NOTE) Elevated high sensitivity troponin I (hsTnI) values and significant  changes across serial measurements may suggest ACS but many other  chronic and acute conditions are known to elevate hsTnI results.  Refer to the "Links" section for chest pain algorithms and additional  guidance. Performed at [facility], [address], [place], [place] [address]   Hepatic function panel     Status: Abnormal   Collection Time: 07/21/22 11:55 AM  Result Value Ref Range   Total Protein 7.1 6.5 - 8.1 g/dL   Albumin  3.3 (L) 3.5 - 5.0 g/dL   AST 19 15 - 41 U/L   ALT 11 0 - 44 U/L   Alkaline Phosphatase 105 38 - 126 U/L   Total Bilirubin 0.6 0.3 - 1.2 mg/dL   Bilirubin, Direct 0.2 0.0 - 0.2 mg/dL   Indirect Bilirubin 0.4 0.3 - 0.9 mg/dL    Comment: Performed at [facility], [address] [place], [place] [address]  Troponin I (High Sensitivity)     Status: None   Collection Time: 07/21/22  2:10 PM  Result Value Ref Range   Troponin I (High Sensitivity) 10 <18 ng/L    Comment: (NOTE) Elevated high sensitivity troponin I (hsTnI) values and significant  changes across serial measurements may suggest ACS but many other  chronic and acute conditions are known to elevate hsTnI results.  Refer to the "Links" section for chest pain algorithms and additional  guidance. Performed at [facility], [address], [place], [place] [address]   Basic metabolic panel     Status:  Abnormal   Collection Time: 07/22/22  2:00 AM  Result Value Ref Range   Sodium 134 (L) 135 - 145 mmol/L   Potassium 3.2 (L) 3.5 - 5.1 mmol/L   Chloride 100 98 - 111 mmol/L   CO2 24 22 - 32 mmol/L   Glucose, Bld 93 70 - 99 mg/dL    Comment: Glucose reference range applies only to samples taken after fasting for at least 8 hours.   BUN 19 8 - 23 mg/dL   Creatinine, Ser 6.60 (H) 0.44 - 1.00 mg/dL   Calcium 8.8 (L) 8.9 - 10.3 mg/dL   GFR, Estimated 34 (L) >60 mL/min    Comment: (NOTE) Calculated using the CKD-EPI Creatinine Equation (2021)    Anion gap 10 5 - 15    Comment: Performed at [facility], [address], [place], [place] [address]  CBC     Status: Abnormal   Collection Time: 07/22/22  2:00 AM  Result Value Ref Range   WBC 8.8 4.0 - 10.5 K/uL   RBC 3.19 (L) 3.87 - 5.11 MIL/uL   Hemoglobin 8.6 (L) 12.0 - 15.0 g/dL   HCT 01.0 (L) 93.2 - 35.5 %   MCV 85.3 80.0 - 100.0 fL   MCH 27.0 26.0 - 34.0 pg   MCHC 31.6 30.0 - 36.0 g/dL   RDW 73.2 (H) 20.2 - 54.2 %   Platelets 180 150 - 400 K/uL   nRBC 0.0 0.0 - 0.2 %    Comment: Performed at [facility], [address], [place], [place] [address]    CT Head Wo Contrast  Result Date: 07/21/2022 CLINICAL DATA:  unclear if falls, on eliquis, diffuse body pain EXAM: CT HEAD WITHOUT CONTRAST CT CERVICAL SPINE WITHOUT CONTRAST TECHNIQUE: Multidetector CT imaging of the head and cervical spine was performed following the standard protocol without intravenous contrast. Multiplanar CT image reconstructions of the cervical spine were also generated. RADIATION DOSE REDUCTION: This exam was performed according to the departmental dose-optimization program which includes automated exposure control, adjustment of the mA and/or kV according to patient size and/or use of iterative reconstruction technique. COMPARISON:  CT head 11/30/2020. FINDINGS: CT HEAD FINDINGS Brain: Discrete cystic area in the right thalamus has increased in size  relative to 2022 prior and now measures 3.0 cm with some regional mass effect. Patchy white matter hypodensities are nonspecific but compatible with chronic microvascular ischemic disease. No evidence  of acute large vascular territory infarct. No evidence of acute hemorrhage. No hydrocephalus. Vascular: Calcific atherosclerosis. Skull: No acute fracture.  High left scalp contusion. Sinuses/Orbits: Clear sinuses.  No acute orbital findings. Other: Small left mastoid effusion. CT CERVICAL SPINE FINDINGS Alignment: Mild anterolisthesis of C3 on C4 and C4 on C5, likely degenerative given facet arthropathy at these levels. Otherwise, no substantial sagittal subluxation. Skull base and vertebrae: No evidence of acute fracture. Vertebral body heights are maintained. Osteopenia. Soft tissues and spinal canal: No prevertebral fluid or swelling. No visible canal hematoma. Disc levels: Severe multilevel degenerative change including facet and uncovertebral hypertrophy with varying degrees of neural foraminal stenosis. Upper chest: Please see concurrent CT of the chest for intrathoracic evaluation. Other: Chronically enlarged thyroid. IMPRESSION: CTA head: 1. Discrete cystic area in the right thalamus has increased in size relative to 2022 prior and now measures 3.0 cm with some regional mass effect. Findings are atypical for cystic encephalomalacia and recommend MRI with contrast to further evaluate and exclude malignancy or enhancement. 2. No evidence of acute hemorrhage. 3. Chronic microvascular ischemic change. CT cervical spine: No evidence of acute fracture or traumatic malalignment. Electronically Signed   By: Feliberto Harts M.D.   On: 07/21/2022 14:39   CT Cervical Spine Wo Contrast  Result Date: 07/21/2022 CLINICAL DATA:  unclear if falls, on eliquis, diffuse body pain EXAM: CT HEAD WITHOUT CONTRAST CT CERVICAL SPINE WITHOUT CONTRAST TECHNIQUE: Multidetector CT imaging of the head and cervical spine was  performed following the standard protocol without intravenous contrast. Multiplanar CT image reconstructions of the cervical spine were also generated. RADIATION DOSE REDUCTION: This exam was performed according to the departmental dose-optimization program which includes automated exposure control, adjustment of the mA and/or kV according to patient size and/or use of iterative reconstruction technique. COMPARISON:  CT head 11/30/2020. FINDINGS: CT HEAD FINDINGS Brain: Discrete cystic area in the right thalamus has increased in size relative to 2022 prior and now measures 3.0 cm with some regional mass effect. Patchy white matter hypodensities are nonspecific but compatible with chronic microvascular ischemic disease. No evidence of acute large vascular territory infarct. No evidence of acute hemorrhage. No hydrocephalus. Vascular: Calcific atherosclerosis. Skull: No acute fracture.  High left scalp contusion. Sinuses/Orbits: Clear sinuses.  No acute orbital findings. Other: Small left mastoid effusion. CT CERVICAL SPINE FINDINGS Alignment: Mild anterolisthesis of C3 on C4 and C4 on C5, likely degenerative given facet arthropathy at these levels. Otherwise, no substantial sagittal subluxation. Skull base and vertebrae: No evidence of acute fracture. Vertebral body heights are maintained. Osteopenia. Soft tissues and spinal canal: No prevertebral fluid or swelling. No visible canal hematoma. Disc levels: Severe multilevel degenerative change including facet and uncovertebral hypertrophy with varying degrees of neural foraminal stenosis. Upper chest: Please see concurrent CT of the chest for intrathoracic evaluation. Other: Chronically enlarged thyroid. IMPRESSION: CTA head: 1. Discrete cystic area in the right thalamus has increased in size relative to 2022 prior and now measures 3.0 cm with some regional mass effect. Findings are atypical for cystic encephalomalacia and recommend MRI with contrast to further  evaluate and exclude malignancy or enhancement. 2. No evidence of acute hemorrhage. 3. Chronic microvascular ischemic change. CT cervical spine: No evidence of acute fracture or traumatic malalignment. Electronically Signed   By: Feliberto Harts M.D.   On: 07/21/2022 14:39   CT CHEST ABDOMEN PELVIS WO CONTRAST  Result Date: 07/21/2022 CLINICAL DATA:  Several falls this week. Right-sided chest and abdominal pain. EXAM: CT  CHEST, ABDOMEN AND PELVIS WITHOUT CONTRAST TECHNIQUE: Multidetector CT imaging of the chest, abdomen and pelvis was performed following the standard protocol without IV contrast. RADIATION DOSE REDUCTION: This exam was performed according to the departmental dose-optimization program which includes automated exposure control, adjustment of the mA and/or kV according to patient size and/or use of iterative reconstruction technique. COMPARISON:  Chest CTA on 05/30/2022 FINDINGS: CT CHEST FINDINGS Cardiovascular: No evidence of mediastinal hematoma. No pericardial effusion. Aortic and coronary atherosclerotic calcification incidentally noted. Mediastinum/Nodes: No evidence of hemorrhage or pneumomediastinum. Stable multinodular goiter. No pathologically enlarged lymph nodes identified on this noncontrast exam. Lungs/Pleura: Small right and tiny left pleural effusion show no significant change compared to prior exam. No evidence of pneumothorax. Right lower lobe atelectasis and scarring is again seen. New focal area of airspace opacity with air bronchograms is seen in the anterior right lower lobe, which may be due to pulmonary contusion or pneumonia. Stable elevation of right hemidiaphragm. Musculoskeletal: Acute, mildly displaced fractures are seen involving the left anterior 3rd through 6th ribs. Old compression fracture of the T11 vertebral body is unchanged in appearance. CT ABDOMEN PELVIS FINDINGS Hepatobiliary: No hepatic parenchymal injury or mass identified on this noncontrast exam.  Layering sludge or tiny gallstones again noted. No evidence of cholecystitis or biliary ductal dilatation. Pancreas: No parenchymal abnormality identified on this noncontrast exam. Spleen: No evidence of parenchymal injury on this noncontrast exam. Adrenal/Urinary Tract: No hemorrhage or parenchymal injury identified on this noncontrast exam. Stable bilateral renal parenchymal scarring. No evidence of hydronephrosis. Distended urinary bladder noted. Stomach/Bowel: Moderate hiatal hernia again seen. No evidence of hemoperitoneum or free intraperitoneal air. Otherwise unremarkable. Vascular/Lymphatic: No evidence of retroperitoneal hemorrhage. Aortic atherosclerotic calcification incidentally noted. No pathologically enlarged lymph nodes identified. Reproductive: Prior hysterectomy noted. Adnexal regions are unremarkable in appearance. Other:  None. Musculoskeletal: No acute fractures or suspicious bone lesions identified. IMPRESSION: Acute, mildly displaced fractures involving the left anterior 3rd through 6th ribs. Evidence of pneumothorax. New focal airspace opacity in the anterior right lower lobe, which may be due to pulmonary contusion or pneumonia. Stable small right and tiny left pleural effusions. No evidence of traumatic injury within the abdomen or pelvis. Stable moderate hiatal hernia. Cholelithiasis. No radiographic evidence of cholecystitis. Aortic Atherosclerosis (ICD10-I70.0). Electronically Signed   By: Danae Orleans M.D.   On: 07/21/2022 14:37   DG Knee 2 Views Right  Result Date: 07/21/2022 CLINICAL DATA:  Fall last week.  Right knee pain. EXAM: RIGHT KNEE - 2 VIEW COMPARISON:  07/02/2020 FINDINGS: No evidence of fracture, dislocation, or joint effusion. Enthesopathic changes are seen involving the patella. No other changes of arthropathy are seen. Extensive peripheral vascular calcification noted. IMPRESSION: No acute findings. Electronically Signed   By: Danae Orleans M.D.   On: 07/21/2022 13:22    DG Forearm Left  Result Date: 07/21/2022 CLINICAL DATA:  Right knee and left arm pain status post fall EXAM: LEFT FOREARM - 2 VIEW COMPARISON:  None Available. FINDINGS: The bones appear diffusely osteopenic. There is an acute, obliquely oriented fracture deformity involving the distal diaphysis of the ulna. No additional fractures identified. Degenerative changes are identified at the basilar joint and radiocarpal joint. Chondrocalcinosis. IMPRESSION: 1. Acute, obliquely oriented fracture deformity involving the distal diaphysis of the ulna. 2. Osteopenia. Electronically Signed   By: Signa Kell M.D.   On: 07/21/2022 13:20   DG Chest 2 View  Result Date: 07/21/2022 CLINICAL DATA:  No chest pain after fall EXAM: CHEST - 2  VIEW COMPARISON:  05/30/2022 FINDINGS: Stable cardiomegaly. Aortic atherosclerosis. Small right pleural effusion with patchy right basilar airspace opacity. Left lung appears clear. No pneumothorax. Bones are demineralized. No definite fracture. IMPRESSION: Small right pleural effusion with patchy right basilar airspace opacity, atelectasis versus pneumonia. Electronically Signed   By: Duanne Guess D.O.   On: 07/21/2022 12:26    Review of Systems  HENT:  Negative for ear discharge, ear pain, hearing loss and tinnitus.   Eyes:  Negative for photophobia and pain.  Respiratory:  Negative for cough and shortness of breath.   Cardiovascular:  Negative for chest pain.  Gastrointestinal:  Negative for abdominal pain, nausea and vomiting.  Genitourinary:  Negative for dysuria, flank pain, frequency and urgency.  Musculoskeletal:  Positive for arthralgias (Left wrist/hand). Negative for back pain, myalgias and neck pain.  Neurological:  Negative for dizziness and headaches.  Hematological:  Does not bruise/bleed easily.  Psychiatric/Behavioral:  The patient is not nervous/anxious.    Blood pressure 120/75, pulse 82, temperature 98.2 F (36.8 C), temperature source Oral, resp.  rate 20, height  (1.702 m), weight 60.8 kg, SpO2 92 %. Physical Exam Constitutional:      General: She is not in acute distress.    Appearance: She is well-developed. She is not diaphoretic.  HENT:     Head: Normocephalic and atraumatic.  Eyes:     General: No scleral icterus.       Right eye: No discharge.        Left eye: No discharge.     Conjunctiva/sclera: Conjunctivae normal.  Cardiovascular:     Rate and Rhythm: Normal rate and regular rhythm.  Pulmonary:     Effort: Pulmonary effort is normal. No respiratory distress.  Musculoskeletal:     Cervical back: Normal range of motion.     Comments: Left shoulder, elbow, wrist, digits- no skin wounds, sugar tong splint in place, no instability, no blocks to motion  Sens  Ax/R/M/U intact  Mot   Ax/ R/ PIN/ M/ AIN/ U intact  Cap refill <2s  RLE No traumatic wounds or rash, erythema over ant knee  Nontender, no fluctuance or bogginess  No knee or ankle effusion  Knee stable to varus/ valgus and anterior/posterior stress  Sens DPN, SPN, TN intact  Motor EHL, ext, flex, evers 5/5  DP 2+, PT 2+, No significant edema  Skin:    General: Skin is warm and dry.  Neurological:     Mental Status: She is alert.  Psychiatric:        Mood and Affect: Mood normal.        Behavior: Behavior normal.     Assessment/Plan: Left distal ulna fx -- Continue splint and NWB. F/u with Dr. Melvyn Novas at discharge. Right knee pain -- Suspect traumatic bursitis, now resolving. CPM. WBAT.    Freeman Caldron, PA-C Orthopedic Surgery [phone] 07/22/2022, 10:52 AM

## 2022-07-22 NOTE — NC FL2 (Signed)
[place] MEDICAID FL2 LEVEL OF CARE FORM     IDENTIFICATION  Patient Name: [name] Birthdate: 04-06-1930 Sex: female Admission Date (Current Location): 07/21/2022  [facility] and [place] Number:  Producer, television/film/video and Address:  [facility], [address], [place], [place] [address]      Provider Number: [id number]  Attending Physician Name and Address:  Lanae Boast, MD  Relative Name and Phone Number:  Sara Chu [phone]    Current Level of Care: Hospital Recommended Level of Care: Skilled Nursing Facility Prior Approval Number:    Date Approved/Denied:   PASRR Number:    Discharge Plan: Hospital    Current Diagnoses: Patient Active Problem List   Diagnosis Date Noted   CAP (community acquired pneumonia) 07/21/2022   Left rib fracture 07/21/2022   Left ulnar fracture 07/21/2022   AKI (acute kidney injury) 07/21/2022   Pain and swelling of right knee 07/21/2022   Dehydration 07/21/2022   Hypoalbuminemia due to protein-calorie malnutrition 07/21/2022   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   History of atrial fibrillation 07/21/2022   Essential tremor 07/21/2022   Depression 07/21/2022   Right leg swelling 07/21/2022    Orientation RESPIRATION BLADDER Height & Weight     Self, Situation, Place  O2 Incontinent Weight: 134 lb 0.6 oz (60.8 kg) Height:   (170.2 cm)  BEHAVIORAL SYMPTOMS/MOOD NEUROLOGICAL BOWEL NUTRITION STATUS      Continent Diet (see discharge summary)  AMBULATORY STATUS COMMUNICATION OF NEEDS Skin   Total Care Verbally Other (Comment) (redness, ecchymosis)                       Personal Care Assistance Level of Assistance  Bathing, Feeding, Dressing Bathing Assistance: Maximum assistance Feeding assistance: Limited assistance Dressing Assistance: Maximum assistance     Functional Limitations Info  Sight, Hearing, Speech Sight Info: Adequate Hearing Info: Impaired Speech Info: Adequate     SPECIAL CARE FACTORS FREQUENCY  PT (By licensed PT), OT (By licensed OT)     PT Frequency: 5x week OT Frequency: 5x week            Contractures Contractures Info: Not present    Additional Factors Info  Allergies, Code Status Code Status Info: DNR Allergies Info: Naproxen, Penicillins, Prozac (Fluoxetine)           Current Medications (07/22/2022):  This is the current hospital active medication list Current Facility-Administered Medications  Medication Dose Route Frequency Provider Last Rate Last Admin   0.9 %  sodium chloride infusion   Intravenous Continuous Kc, Ramesh, MD 75 mL/hr at 07/22/22 1132 New Bag at 07/22/22 1132   acetaminophen (TYLENOL) tablet 650 mg  650 mg Oral Q6H PRN Adefeso, Oladapo, DO       Or   acetaminophen (TYLENOL) suppository 650 mg  650 mg Rectal Q6H PRN Adefeso, Oladapo, DO       acetaminophen (TYLENOL) tablet 1,000 mg  1,000 mg Oral BID Kc, Ramesh, MD   1,000 mg at 07/22/22 1544   acidophilus (RISAQUAD) capsule 1 capsule  1 capsule Oral Daily Kc, Ramesh, MD   1 capsule at 07/22/22 1544   ALPRAZolam (XANAX) tablet 0.5 mg  0.5 mg Oral BID PRN Ulice Bold, NP       ALPRAZolam Prudy Feeler) tablet 0.5 mg  0.5 mg Oral BID Kc, Ramesh, MD   0.5 mg at 07/22/22 1543   apixaban (ELIQUIS) tablet 2.5 mg  2.5 mg Oral BID Kc,  Dayna Barker, MD       azithromycin (ZITHROMAX) 500 mg in sodium chloride 0.9 % 250 mL IVPB  500 mg Intravenous Q24H Adefeso, Oladapo, DO       bisacodyl (DULCOLAX) suppository 10 mg  10 mg Rectal Daily PRN Lanae Boast, MD   10 mg at 07/22/22 1519   cefTRIAXone (ROCEPHIN) 2 g in sodium chloride 0.9 % 100 mL IVPB  2 g Intravenous Q24H Adefeso, Oladapo, DO       cholecalciferol (VITAMIN D3) 25 MCG (1000 UNIT) tablet 1,000 Units  1,000 Units Oral Daily Kc, Dayna Barker, MD   1,000 Units at 07/22/22 1544   cyanocobalamin (VITAMIN B12) tablet 1,000 mcg  1,000 mcg Oral Daily Kc, Dayna Barker, MD   1,000 mcg at 07/22/22 1543   dextromethorphan-guaiFENesin  (MUCINEX DM) 30-600 MG per 12 hr tablet 1 tablet  1 tablet Oral BID Adefeso, Oladapo, DO   1 tablet at 07/22/22 1141   feeding supplement (GLUCERNA SHAKE) (GLUCERNA SHAKE) liquid 237 mL  237 mL Oral TID BM Adefeso, Oladapo, DO   237 mL at 07/22/22 1127   guaiFENesin (ROBITUSSIN) 100 MG/5ML liquid 200 mg  200 mg Oral Q6H PRN Kc, Ramesh, MD       ipratropium-albuterol (DUONEB) 0.5-2.5 (3) MG/3ML nebulizer solution 3 mL  3 mL Nebulization Q4H PRN Adefeso, Oladapo, DO       lidocaine (LIDODERM) 5 % 1 patch  1 patch Transdermal Q24H Adefeso, Oladapo, DO   1 patch at 07/22/22 1550   mirtazapine (REMERON) tablet 7.5 mg  7.5 mg Oral QHS Kc, Ramesh, MD       ondansetron (ZOFRAN) tablet 4 mg  4 mg Oral Q6H PRN Adefeso, Oladapo, DO       Or   ondansetron (ZOFRAN) injection 4 mg  4 mg Intravenous Q6H PRN Adefeso, Oladapo, DO       oxyCODONE-acetaminophen (PERCOCET/ROXICET) 5-325 MG per tablet 1 tablet  1 tablet Oral Q4H PRN Adefeso, Oladapo, DO   1 tablet at 07/21/22 2156   polyethylene glycol (MIRALAX / GLYCOLAX) packet 17 g  17 g Oral Daily PRN Kc, Dayna Barker, MD       [START ON 07/23/2022] predniSONE (DELTASONE) tablet 5 mg  5 mg Oral Q breakfast Kc, Ramesh, MD       senna-docusate (Senokot-S) tablet 1 tablet  1 tablet Oral Daily Kc, Ramesh, MD   1 tablet at 07/22/22 1543   tamsulosin (FLOMAX) capsule 0.4 mg  0.4 mg Oral Daily Kc, Ramesh, MD   0.4 mg at 07/22/22 1543   traZODone (DESYREL) tablet 50 mg  50 mg Oral QHS Kc, Ramesh, MD       venlafaxine XR (EFFEXOR-XR) 24 hr capsule 75 mg  75 mg Oral Daily Pham, Minh Q, RPH-CPP   75 mg at 07/22/22 1141     Discharge Medications: Please see discharge summary for a list of discharge medications.  Relevant Imaging Results:  Relevant Lab Results:   Additional Information SSN#:  [id number]  Lorri Frederick, LCSW

## 2022-07-22 NOTE — Progress Notes (Signed)
Right lower extremity venous  has been completed. Refer to [facility] under chart review to view preliminary results.   07/22/2022  12:03 PM Akhila Mahnken, Gerarda Gunther '

## 2022-07-23 DIAGNOSIS — Z515 Encounter for palliative care: Secondary | ICD-10-CM | POA: Diagnosis not present

## 2022-07-23 DIAGNOSIS — J189 Pneumonia, unspecified organism: Secondary | ICD-10-CM | POA: Diagnosis not present

## 2022-07-23 DIAGNOSIS — Z7189 Other specified counseling: Secondary | ICD-10-CM | POA: Diagnosis not present

## 2022-07-23 LAB — BASIC METABOLIC PANEL
Anion gap: 6 (ref 5–15)
BUN: 17 mg/dL (ref 8–23)
CO2: 24 mmol/L (ref 22–32)
Calcium: 8.9 mg/dL (ref 8.9–10.3)
Chloride: 104 mmol/L (ref 98–111)
Creatinine, Ser: 1.13 mg/dL — ABNORMAL HIGH (ref 0.44–1.00)
GFR, Estimated: 46 mL/min — ABNORMAL LOW (ref >60)
Glucose, Bld: 103 mg/dL — ABNORMAL HIGH (ref 70–99)
Potassium: 3.8 mmol/L (ref 3.5–5.1)
Sodium: 134 mmol/L — ABNORMAL LOW (ref 135–145)

## 2022-07-23 LAB — CBC
HCT: 25.4 % — ABNORMAL LOW (ref 36.0–46.0)
Hemoglobin: 8 g/dL — ABNORMAL LOW (ref 12.0–15.0)
MCH: 26.8 pg (ref 26.0–34.0)
MCHC: 31.5 g/dL (ref 30.0–36.0)
MCV: 84.9 fL (ref 80.0–100.0)
Platelets: 190 10*3/uL (ref 150–400)
RBC: 2.99 MIL/uL — ABNORMAL LOW (ref 3.87–5.11)
RDW: 19.6 % — ABNORMAL HIGH (ref 11.5–15.5)
WBC: 9.2 10*3/uL (ref 4.0–10.5)
nRBC: 0 % (ref 0.0–0.2)

## 2022-07-23 NOTE — Progress Notes (Signed)
Palliative:  HPI: 89 y.o. female  with past medical history of paroxysmal atrial fibrillation on Eliquis, essential tremor, rheumatoid arthritis, anxiety, depression admitted from [facility] on 07/21/2022 with left-sided chest pain following a fall ~1 week ago now worsening with movement. Also with increasing shortness of breath, nonproductive cough and found to have community acquired pneumonia, left rib fracture, left ulna fracture, right knee swelling, acute kidney injury, increased size right thalamic mass. Rule out RLE DVT.   I met today with Jamesetta So. She is lying in bed and seems to just be awakening from nap. She talks about needing to get out of bed and needing to get dressed. I reminded her that she is in the hospital and we do not want her getting out of bed alone. We discussed using hospital gown while she is here. She answers questions appropriately but needing redirection at times today. CSW came to bedside and confirms [facility] can take her with potential for discharge to [facility] tomorrow. Violeta agrees with plan.   I called and spoke with grandson, [place]. Susy Frizzle shares with me about his interactions with his grandmother. He shares that there have been times that she has had some confusion - specifically thinking that her husband is still alive. We discussed delirium with UTIs/infections and hospitalizations vs dementia. We discussed orientation, good night sleep, and techniques to reinforce orientation. I referred him to SNF CSW to discuss long term options if indicated and interested in higher level of care long term. We also discussed outpatient palliative to follow and he agrees. He does notice some correlations between his grandmother's health decline and signs at the end of his grandfather's life. He understands that her time may be limited and agrees with conservative treatment but no invasive/aggressive measures to prolong life. He knows that his grandmother is at peace and ready to  go when her time comes.   All questions/concerns addressed. Emotional support provided. Discussed with CSW.   Exam: Alert, some confusion/forgetfulness in conversation. No distress. Generalized weakness and fatigue. Breathing regular, unlabored. Nonproductive cough. Abd soft. L arm in sling and wrapped (RN to readdress as hand is very swollen).   Plan: - DNR - Comfort and quality is priority over prolonging life - SNF rehab with outpatient palliative follow up  50 min  Yong Channel, NP Palliative Medicine Team Pager [phone] (Please see [email or web address] for schedule) Team Phone [phone]    Greater than 50%  of this time was spent counseling and coordinating care related to the above assessment and plan

## 2022-07-23 NOTE — Progress Notes (Signed)
Noted swelling to Left hand splint appeared to tight reported to [facility] ortho tech came around 3p to assess splint. Splint removed skin tear found to left wrist cleaned xerform and foam dressing applied. Splint reapplied by ortho tech will monitor

## 2022-07-23 NOTE — Progress Notes (Signed)
PROGRESS NOTE Kadie Balestrieri Tadesse  WUJ:[id number] DOB: 03-25-1930 DOA: 07/21/2022 PCP: Joycelyn Man, NP  Brief Narrative/Hospital Course: 89 y.o.f w/ significant of anxiety, depression, essential tremor, PAF on Eliquis presented to the EDfrom [facility] due to left-sided chest pain/shortness of breaths, reported he had a fall and subsequently having chest pain and left ankle started and has been worsening, also complaining of swelling and redness of right knee and was currently on antibiotics for this. Patient endorsed history of pleural effusions and states that she had drainage of the chest a few weeks ago. She denied fever, chills, palpitations, nausea, vomiting, abdominal pain, blood in stools.     In NF:[id number] with respiratory rate in low 20s, pulse 50 bpm, BP 120/49, temperature 98.2 F, O2 sat was 97%.  Workup in the ED showed normocytic anemia, WBC 11.5, MCV 87.7, platelets 202.  BMP was normal sodium of 134, blood glucose 157, creatinine 1.39 (baseline creatinine at 0.8-0.9).  Troponin x 2 was negative.  Albumin 3.3  CT head and CT cervical spine without contrast showed 1. Discrete cystic area in the right thalamus has increased in size relative to 2022 prior and now measures 3.0 cm with some regional mass effect. Findings are atypical for cystic encephalomalacia and recommend MRI with contrast to further evaluate and exclude malignancy or enhancement. 2. No evidence of acute hemorrhage. 3. Chronic microvascular ischemic change.  CT cervical spine:No evidence of acute fracture or traumatic malalignment.  CT chest, abdomen and pelvis without contrast showed: Acute, mildly displaced fractures involving the left anterior 3rd through 6th ribs. No evidence of pneumothorax. New focal airspace opacity in the anterior right lower lobe, which may be due to pulmonary contusion or pneumonia.  No evidence of traumatic injury within the abdomen or pelvis.  Stable moderate hiatal  hernia.  Cholelithiasis. No radiographic evidence of cholecystitis.  Left forearm x-ray showed acute, obliquely oriented fracture deformity involving the distal diaphysis of the ulna.  Osteopenia Chest x-ray showed small right pleural effusion with patchy right basilar airspace opacity, atelectasis versus pneumonia Right knee x-ray showed no acute findings  Patient was started on IV antibiotics, breathing treatment, antitussives lidocaine patch pain management IV fluids and admitted for acute respiratory failure with hypoxia in the setting of rib fracture, pneumonia/consolidation/?contusion, left ulna fracture right knee pain and swelling, AKI, thalamic mass. Patient refused further MRI and workup, palliative care was consulted. Orthopedic advised outpatient hand surgery follow-up       Subjective: Seen and examined this morning resting comfortably has no complaint  Doing well on 2 L nasal cannula, afebrile overnight BP stable   Assessment and Plan: Principal Problem:   CAP (community acquired pneumonia) Active Problems:   Left rib fracture   Left ulnar fracture   AKI (acute kidney injury)   Pain and swelling of right knee   Dehydration   Hypoalbuminemia due to protein-calorie malnutrition   Thalamic mass   History of atrial fibrillation   Essential tremor   Depression   Right leg swelling   Left distal ulna fracture: Seen by orthopedics.Advised to continue splint, NWB, and follow-up with Dr. Orlan Leavens W/ hand surgery as OP.  Right knee pain suspect due to traumatic bursitis now resolving WBAT, continue pain management. Cont PTOT/  Acute respiratory failure with hypoxia in the setting of pneumonia Community-acquired pneumonia Left rib fractures at multiple sites: Hypoxia in the setting of left rib fractures, pneumonia.  Continue current ceftriaxone azithromycin, bronchodilators.  COVID screen was negative.  Respiratory  status overall stable on 2 L nasal cannula.  Continue pain  management for rib fractures>Mucinex incentive spirometry flutter valve and supplemental oxygen, LIDoCAINE PATCH.  Currently afebrile  On-call surgeon Dr. Corliss Skains was consulted and stated patient does not need trauma admit at this time since fall was about a week ago and it was only rib fractures.  Concern for dysphagia/aspiration but patient does not want to go to through speech evaluation  Acute kidney injury Dehydration: Creatinine improved to 1.1 encourage oral hydration, Cont to avoid nephrotoxic medic medication Recent Labs    07/21/22 1155 07/22/22 0200 07/23/22 0159  BUN 21 19 17   CREATININE 1.39* 1.46* 1.13*    Hypoalbuminemia possibly secondary to mild protein caloric malnutrition Albumin 3.3, protein supplement will be provided. RD consulted  Dysphagia/coughing team: does not want to pursue speech evaluation placed on regular diet  Thalamic mass Right thalamic mass (chronic) showed increase in size relative to 2022 and is currently at 3.0 cm:  Neurologist (Dr. Selina Cooley) was consulted and recommended MRI brain with and without contrast to ensure no underlying malignancy or concerning findings.  When discussed with the patient she does not want to proceed any further workup due to her age. PMT following   RLE swelling more than left: Duplex studies was negative for DVT.   Chronic atrial fibrillation rate controlled cont home eliquis  Depression: Mood is stable, on venlafaxine   Essential tremor:stable  Goals of care: Currently DNR patient wishes to be comfortable does not want to pursue any workup or MRI brain, palliative care has been consulted.  On discussion with family they want to pursue short-term rehab, also family informed about hospice options.   DVT prophylaxis: apixaban (ELIQUIS) tablet 2.5 mg Start: 07/22/22 2000 SCDs Start: 07/21/22 1625 Code Status:   Code Status: DNR Family Communication: plan of care discussed with patient at bedside. Patient status is:   inpatient  because of respiratory failure for fractures Level of care: Med-Surg   Dispo: The patient is from: ALF            Anticipated disposition: Patient reports she is not decided on a SNF yet.   Objective: Vitals last 24 hrs: Vitals:   07/22/22 0728 07/22/22 1515 07/22/22 2020 07/23/22 0742  BP: 120/75 113/77 136/86 137/60  Pulse: 82 (!) 41 80 62  Resp:   17 15  Temp: 98.2 F (36.8 C) 97.8 F (36.6 C) 98 F (36.7 C) 98.4 F (36.9 C)  TempSrc: Oral Oral Oral   SpO2: 92% 90% 97% 91%  Weight:      Height:       Weight change:   Physical Examination: General exam: AA,hard of hearing, on nasal cannula oxygen appears weak, elderly and debilitated HEENT:Oral mucosa moist, Ear/Nose WNL grossly, dentition normal. Respiratory system: bilaterally diminished BS, tender left chest wall Cardiovascular system: S1 & S2 +, regular rate. Gastrointestinal system: Abdomen soft, NT,ND,BS+ Nervous System:Alert, awake, moving extremities and grossly nonfocal Extremities: LE ankle edema mild, Ledt arm in splint Skin: No rashes,no icterus. MSK: small and weak muscle bulk,tone, power   Medications reviewed:  Scheduled Meds:  acetaminophen  1,000 mg Oral BID   acidophilus  1 capsule Oral Daily   ALPRAZolam  0.5 mg Oral BID   apixaban  2.5 mg Oral BID   cholecalciferol  1,000 Units Oral Daily   cyanocobalamin  1,000 mcg Oral Daily   dextromethorphan-guaiFENesin  1 tablet Oral BID   feeding supplement (GLUCERNA SHAKE)  237 mL Oral TID  BM   lidocaine  1 patch Transdermal Q24H   mirtazapine  7.5 mg Oral QHS   predniSONE  5 mg Oral Q breakfast   senna-docusate  1 tablet Oral Daily   tamsulosin  0.4 mg Oral Daily   traZODone  50 mg Oral QHS   venlafaxine XR  75 mg Oral Daily  Continuous Infusions:  sodium chloride 75 mL/hr at 07/22/22 1132   azithromycin 500 mg (07/22/22 1640)   cefTRIAXone (ROCEPHIN)  IV 2 g (07/22/22 1749)    Diet Order             Diet Heart Room service  appropriate? No; Fluid consistency: Thin  Diet effective now                  Intake/Output Summary (Last 24 hours) at 07/23/2022 1043 Last data filed at 07/23/2022 0900 Gross per 24 hour  Intake 960.82 ml  Output 700 ml  Net 260.82 ml   Net IO Since Admission: -139.18 mL [07/23/22 1043]  Wt Readings from Last 3 Encounters:  07/21/22 60.8 kg  11/30/20 82 kg  07/04/20 72.6 kg    Unresulted Labs (From admission, onward)     Start     Ordered   07/23/22 0500  Basic metabolic panel  Daily,   R      07/22/22 0858   07/23/22 0500  CBC  Daily,   R      07/22/22 0858          Data Reviewed: I have personally reviewed following labs and imaging studies CBC: Recent Labs  Lab 07/21/22 1155 07/22/22 0200 07/23/22 0159  WBC 11.5* 8.8 9.2  HGB 8.8* 8.6* 8.0*  HCT 29.3* 27.2* 25.4*  MCV 87.7 85.3 84.9  PLT 202 180 190   Basic Metabolic Panel: Recent Labs  Lab 07/21/22 1155 07/22/22 0200 07/23/22 0159  NA 134* 134* 134*  K 3.5 3.2* 3.8  CL 98 100 104  CO2 25 24 24   GLUCOSE 157* 93 103*  BUN 21 19 17   CREATININE 1.39* 1.46* 1.13*  CALCIUM 8.8* 8.8* 8.9  FVC:BSWHQPRFF Creatinine Clearance: 31.8 mL/min (A) (by C-G formula based on SCr of 1.13 mg/dL (H)). Liver Function Tests: Recent Labs  Lab 07/21/22 1155  AST 19  ALT 11  ALKPHOS 105  BILITOT 0.6  PROT 7.1  ALBUMIN 3.3*  Antimicrobials: Anti-infectives (From admission, onward)    Start     Dose/Rate Route Frequency Ordered Stop   07/22/22 1400  cefTRIAXone (ROCEPHIN) 2 g in sodium chloride 0.9 % 100 mL IVPB        2 g 200 mL/hr over 30 Minutes Intravenous Every 24 hours 07/21/22 1629 07/27/22 1359   07/22/22 1400  azithromycin (ZITHROMAX) 500 mg in sodium chloride 0.9 % 250 mL IVPB        500 mg 250 mL/hr over 60 Minutes Intravenous Every 24 hours 07/21/22 1629 07/27/22 1359   07/21/22 1245  cefTRIAXone (ROCEPHIN) 1 g in sodium chloride 0.9 % 100 mL IVPB        1 g 200 mL/hr over 30 Minutes Intravenous   Once 07/21/22 1241 07/21/22 1632   07/21/22 1245  azithromycin (ZITHROMAX) 500 mg in sodium chloride 0.9 % 250 mL IVPB        500 mg 250 mL/hr over 60 Minutes Intravenous  Once 07/21/22 1241 07/21/22 1632      Culture/Microbiology No results found for: "SDES", "SPECREQUEST", "CULT", "REPTSTATUS"  Radiology Studies: VAS [place] LOWER EXTREMITY  VENOUS (DVT)  Result Date: 07/22/2022  Lower Venous DVT Study Patient Name:  YASHA TIBBETT [facility]  Date of Exam:   07/22/2022 Medical Rec #: [id number]           Accession #:    [id number] Date of Birth: 01-30-1930            Patient Gender: F Patient Age:   82 years Exam Location:  [facility] Procedure:      VAS [place] LOWER EXTREMITY VENOUS (DVT) Referring Phys: Myrtha Mantis ADEFESO --------------------------------------------------------------------------------  Indications: Patient had a fall that resulted in pain, redness in right knee and a swollen ankle.  Comparison Study: No priors. Performing Technologist: Marilynne Halsted RDMS, RVT  Examination Guidelines: A complete evaluation includes B-mode imaging, spectral Doppler, color Doppler, and power Doppler as needed of all accessible portions of each vessel. Bilateral testing is considered an integral part of a complete examination. Limited examinations for reoccurring indications may be performed as noted. The reflux portion of the exam is performed with the patient in reverse Trendelenburg.  +---------+---------------+---------+-----------+----------+--------------+ RIGHT    CompressibilityPhasicitySpontaneityPropertiesThrombus Aging +---------+---------------+---------+-----------+----------+--------------+ CFV      Full           Yes      Yes                                 +---------+---------------+---------+-----------+----------+--------------+ SFJ      Full                                                        +---------+---------------+---------+-----------+----------+--------------+ FV  Prox  Full                                                        +---------+---------------+---------+-----------+----------+--------------+ FV Mid   Full                                                        +---------+---------------+---------+-----------+----------+--------------+ FV DistalFull           Yes      Yes                                 +---------+---------------+---------+-----------+----------+--------------+ PFV      Full                                                        +---------+---------------+---------+-----------+----------+--------------+ POP      Full           Yes      Yes                                 +---------+---------------+---------+-----------+----------+--------------+  PTV      Full                                                        +---------+---------------+---------+-----------+----------+--------------+ PERO     Full                                                        +---------+---------------+---------+-----------+----------+--------------+   +----+---------------+---------+-----------+----------+--------------+ LEFTCompressibilityPhasicitySpontaneityPropertiesThrombus Aging +----+---------------+---------+-----------+----------+--------------+ CFV Full           Yes      Yes                                 +----+---------------+---------+-----------+----------+--------------+ SFJ Full                                                        +----+---------------+---------+-----------+----------+--------------+     Summary: RIGHT: - There is no evidence of deep vein thrombosis in the lower extremity.  - No cystic structure found in the popliteal fossa.  LEFT: - No evidence of common femoral vein obstruction.  *See table(s) above for measurements and observations. Electronically signed by Sherald Hess MD on 07/22/2022 at 1:07:05 PM.    Final    CT Head Wo Contrast  Result Date:  07/21/2022 CLINICAL DATA:  unclear if falls, on eliquis, diffuse body pain EXAM: CT HEAD WITHOUT CONTRAST CT CERVICAL SPINE WITHOUT CONTRAST TECHNIQUE: Multidetector CT imaging of the head and cervical spine was performed following the standard protocol without intravenous contrast. Multiplanar CT image reconstructions of the cervical spine were also generated. RADIATION DOSE REDUCTION: This exam was performed according to the departmental dose-optimization program which includes automated exposure control, adjustment of the mA and/or kV according to patient size and/or use of iterative reconstruction technique. COMPARISON:  CT head 11/30/2020. FINDINGS: CT HEAD FINDINGS Brain: Discrete cystic area in the right thalamus has increased in size relative to 2022 prior and now measures 3.0 cm with some regional mass effect. Patchy white matter hypodensities are nonspecific but compatible with chronic microvascular ischemic disease. No evidence of acute large vascular territory infarct. No evidence of acute hemorrhage. No hydrocephalus. Vascular: Calcific atherosclerosis. Skull: No acute fracture.  High left scalp contusion. Sinuses/Orbits: Clear sinuses.  No acute orbital findings. Other: Small left mastoid effusion. CT CERVICAL SPINE FINDINGS Alignment: Mild anterolisthesis of C3 on C4 and C4 on C5, likely degenerative given facet arthropathy at these levels. Otherwise, no substantial sagittal subluxation. Skull base and vertebrae: No evidence of acute fracture. Vertebral body heights are maintained. Osteopenia. Soft tissues and spinal canal: No prevertebral fluid or swelling. No visible canal hematoma. Disc levels: Severe multilevel degenerative change including facet and uncovertebral hypertrophy with varying degrees of neural foraminal stenosis. Upper chest: Please see concurrent CT of the chest for intrathoracic evaluation. Other: Chronically enlarged thyroid. IMPRESSION: CTA head: 1. Discrete cystic area in the  right thalamus has increased in size relative to 2022 prior and  now measures 3.0 cm with some regional mass effect. Findings are atypical for cystic encephalomalacia and recommend MRI with contrast to further evaluate and exclude malignancy or enhancement. 2. No evidence of acute hemorrhage. 3. Chronic microvascular ischemic change. CT cervical spine: No evidence of acute fracture or traumatic malalignment. Electronically Signed   By: Feliberto Harts M.D.   On: 07/21/2022 14:39   CT Cervical Spine Wo Contrast  Result Date: 07/21/2022 CLINICAL DATA:  unclear if falls, on eliquis, diffuse body pain EXAM: CT HEAD WITHOUT CONTRAST CT CERVICAL SPINE WITHOUT CONTRAST TECHNIQUE: Multidetector CT imaging of the head and cervical spine was performed following the standard protocol without intravenous contrast. Multiplanar CT image reconstructions of the cervical spine were also generated. RADIATION DOSE REDUCTION: This exam was performed according to the departmental dose-optimization program which includes automated exposure control, adjustment of the mA and/or kV according to patient size and/or use of iterative reconstruction technique. COMPARISON:  CT head 11/30/2020. FINDINGS: CT HEAD FINDINGS Brain: Discrete cystic area in the right thalamus has increased in size relative to 2022 prior and now measures 3.0 cm with some regional mass effect. Patchy white matter hypodensities are nonspecific but compatible with chronic microvascular ischemic disease. No evidence of acute large vascular territory infarct. No evidence of acute hemorrhage. No hydrocephalus. Vascular: Calcific atherosclerosis. Skull: No acute fracture.  High left scalp contusion. Sinuses/Orbits: Clear sinuses.  No acute orbital findings. Other: Small left mastoid effusion. CT CERVICAL SPINE FINDINGS Alignment: Mild anterolisthesis of C3 on C4 and C4 on C5, likely degenerative given facet arthropathy at these levels. Otherwise, no substantial sagittal  subluxation. Skull base and vertebrae: No evidence of acute fracture. Vertebral body heights are maintained. Osteopenia. Soft tissues and spinal canal: No prevertebral fluid or swelling. No visible canal hematoma. Disc levels: Severe multilevel degenerative change including facet and uncovertebral hypertrophy with varying degrees of neural foraminal stenosis. Upper chest: Please see concurrent CT of the chest for intrathoracic evaluation. Other: Chronically enlarged thyroid. IMPRESSION: CTA head: 1. Discrete cystic area in the right thalamus has increased in size relative to 2022 prior and now measures 3.0 cm with some regional mass effect. Findings are atypical for cystic encephalomalacia and recommend MRI with contrast to further evaluate and exclude malignancy or enhancement. 2. No evidence of acute hemorrhage. 3. Chronic microvascular ischemic change. CT cervical spine: No evidence of acute fracture or traumatic malalignment. Electronically Signed   By: Feliberto Harts M.D.   On: 07/21/2022 14:39   CT CHEST ABDOMEN PELVIS WO CONTRAST  Result Date: 07/21/2022 CLINICAL DATA:  Several falls this week. Right-sided chest and abdominal pain. EXAM: CT CHEST, ABDOMEN AND PELVIS WITHOUT CONTRAST TECHNIQUE: Multidetector CT imaging of the chest, abdomen and pelvis was performed following the standard protocol without IV contrast. RADIATION DOSE REDUCTION: This exam was performed according to the departmental dose-optimization program which includes automated exposure control, adjustment of the mA and/or kV according to patient size and/or use of iterative reconstruction technique. COMPARISON:  Chest CTA on 05/30/2022 FINDINGS: CT CHEST FINDINGS Cardiovascular: No evidence of mediastinal hematoma. No pericardial effusion. Aortic and coronary atherosclerotic calcification incidentally noted. Mediastinum/Nodes: No evidence of hemorrhage or pneumomediastinum. Stable multinodular goiter. No pathologically enlarged lymph  nodes identified on this noncontrast exam. Lungs/Pleura: Small right and tiny left pleural effusion show no significant change compared to prior exam. No evidence of pneumothorax. Right lower lobe atelectasis and scarring is again seen. New focal area of airspace opacity with air bronchograms is seen in the anterior right  lower lobe, which may be due to pulmonary contusion or pneumonia. Stable elevation of right hemidiaphragm. Musculoskeletal: Acute, mildly displaced fractures are seen involving the left anterior 3rd through 6th ribs. Old compression fracture of the T11 vertebral body is unchanged in appearance. CT ABDOMEN PELVIS FINDINGS Hepatobiliary: No hepatic parenchymal injury or mass identified on this noncontrast exam. Layering sludge or tiny gallstones again noted. No evidence of cholecystitis or biliary ductal dilatation. Pancreas: No parenchymal abnormality identified on this noncontrast exam. Spleen: No evidence of parenchymal injury on this noncontrast exam. Adrenal/Urinary Tract: No hemorrhage or parenchymal injury identified on this noncontrast exam. Stable bilateral renal parenchymal scarring. No evidence of hydronephrosis. Distended urinary bladder noted. Stomach/Bowel: Moderate hiatal hernia again seen. No evidence of hemoperitoneum or free intraperitoneal air. Otherwise unremarkable. Vascular/Lymphatic: No evidence of retroperitoneal hemorrhage. Aortic atherosclerotic calcification incidentally noted. No pathologically enlarged lymph nodes identified. Reproductive: Prior hysterectomy noted. Adnexal regions are unremarkable in appearance. Other:  None. Musculoskeletal: No acute fractures or suspicious bone lesions identified. IMPRESSION: Acute, mildly displaced fractures involving the left anterior 3rd through 6th ribs. Evidence of pneumothorax. New focal airspace opacity in the anterior right lower lobe, which may be due to pulmonary contusion or pneumonia. Stable small right and tiny left pleural  effusions. No evidence of traumatic injury within the abdomen or pelvis. Stable moderate hiatal hernia. Cholelithiasis. No radiographic evidence of cholecystitis. Aortic Atherosclerosis (ICD10-I70.0). Electronically Signed   By: Danae Orleans M.D.   On: 07/21/2022 14:37   DG Knee 2 Views Right  Result Date: 07/21/2022 CLINICAL DATA:  Fall last week.  Right knee pain. EXAM: RIGHT KNEE - 2 VIEW COMPARISON:  07/02/2020 FINDINGS: No evidence of fracture, dislocation, or joint effusion. Enthesopathic changes are seen involving the patella. No other changes of arthropathy are seen. Extensive peripheral vascular calcification noted. IMPRESSION: No acute findings. Electronically Signed   By: Danae Orleans M.D.   On: 07/21/2022 13:22   DG Forearm Left  Result Date: 07/21/2022 CLINICAL DATA:  Right knee and left arm pain status post fall EXAM: LEFT FOREARM - 2 VIEW COMPARISON:  None Available. FINDINGS: The bones appear diffusely osteopenic. There is an acute, obliquely oriented fracture deformity involving the distal diaphysis of the ulna. No additional fractures identified. Degenerative changes are identified at the basilar joint and radiocarpal joint. Chondrocalcinosis. IMPRESSION: 1. Acute, obliquely oriented fracture deformity involving the distal diaphysis of the ulna. 2. Osteopenia. Electronically Signed   By: Signa Kell M.D.   On: 07/21/2022 13:20   DG Chest 2 View  Result Date: 07/21/2022 CLINICAL DATA:  No chest pain after fall EXAM: CHEST - 2 VIEW COMPARISON:  05/30/2022 FINDINGS: Stable cardiomegaly. Aortic atherosclerosis. Small right pleural effusion with patchy right basilar airspace opacity. Left lung appears clear. No pneumothorax. Bones are demineralized. No definite fracture. IMPRESSION: Small right pleural effusion with patchy right basilar airspace opacity, atelectasis versus pneumonia. Electronically Signed   By: Duanne Guess D.O.   On: 07/21/2022 12:26     LOS: 2 days  Lanae Boast,  MD [facility]  07/23/2022, 10:43 AM

## 2022-07-23 NOTE — TOC Progression Note (Addendum)
Transition of Care [facility]) - Progression Note    Patient Details  Name: KELBY BROADBENT MRN: [id number] Date of Birth: [birth year]  Transition of Care [facility]) CM/SW Contact  Lorri Frederick, LCSW Phone Number: 07/23/2022, 8:16 AM  Clinical Narrative:    Per yesterday phone call with [place] [facility], pt SSN is attached to a different name in their system.  SSN needs to be verified.  CSW LM with grandson Susy Frizzle, received VM back confirming that SSN listed in epic is pt correct SSN.  CSW spoke with [place] [facility], now told that SSN is correct but DOB is not.  [place] must asking that copy of drivers license be faxed.  1330: Bed offers provided to grandson, he wants to accept offer at [facility].  [facility] informed and can accept pt tomorrow.  [place] will submit insurance auth request.   Also discussed with pt and son need for driver's license, neither has it.  CSW verified DOB verbally and both confirm it is correct in epic.  Called [facility] back and they corrected it.  PASSR: [id number]     Expected Discharge Plan: Skilled Nursing Facility Barriers to Discharge: SNF Pending bed offer  Expected Discharge Plan and Services In-house Referral: Clinical Social Work   Post Acute Care Choice:  (TBD) Living arrangements for the past 2 months:  ([facility])                                       Social Determinants of Health (SDOH) Interventions    Readmission Risk Interventions     No data to display

## 2022-07-23 NOTE — Progress Notes (Signed)
Orthopedic Tech Progress Note Patient Details:  [name] 04/07/1930 [id number]  Ortho Devices Type of Ortho Device: Sugartong splint Ortho Device/Splint Location: LUE Ortho Device/Splint Interventions: Application  Verbal order from ortho PA to re-apply sugar tong. Dressing change by RN, and splint applied on top. Patients arm elevated to reduce swelling. Post Interventions Patient Tolerated: Well  Sherilyn Banker 07/23/2022, 6:56 PM

## 2022-07-24 DIAGNOSIS — Z7189 Other specified counseling: Secondary | ICD-10-CM | POA: Diagnosis not present

## 2022-07-24 DIAGNOSIS — J189 Pneumonia, unspecified organism: Secondary | ICD-10-CM | POA: Diagnosis not present

## 2022-07-24 DIAGNOSIS — Z515 Encounter for palliative care: Secondary | ICD-10-CM | POA: Diagnosis not present

## 2022-07-24 NOTE — Progress Notes (Signed)
PROGRESS NOTE Terra Aveni Rhudy  NGE:[id number] DOB: 1929-06-14 DOA: 07/21/2022 PCP: Joycelyn Man, NP  Brief Narrative/Hospital Course: 89 y.o.f w/ significant of anxiety, depression, essential tremor, PAF on Eliquis presented to the EDfrom [facility] due to left-sided chest pain/shortness of breaths, reported he had a fall and subsequently having chest pain and left ankle started and has been worsening, also complaining of swelling and redness of right knee and was currently on antibiotics for this. Patient endorsed history of pleural effusions and states that she had drainage of the chest a few weeks ago. She denied fever, chills, palpitations, nausea, vomiting, abdominal pain, blood in stools.     In MW:[id number] with respiratory rate in low 20s, pulse 50 bpm, BP 120/49, temperature 98.2 F, O2 sat was 97%.  Workup in the ED showed normocytic anemia, WBC 11.5, MCV 87.7, platelets 202.  BMP was normal sodium of 134, blood glucose 157, creatinine 1.39 (baseline creatinine at 0.8-0.9).  Troponin x 2 was negative.  Albumin 3.3  CT head and CT cervical spine without contrast showed 1. Discrete cystic area in the right thalamus has increased in size relative to 2022 prior and now measures 3.0 cm with some regional mass effect. Findings are atypical for cystic encephalomalacia and recommend MRI with contrast to further evaluate and exclude malignancy or enhancement. 2. No evidence of acute hemorrhage. 3. Chronic microvascular ischemic change.  CT cervical spine:No evidence of acute fracture or traumatic malalignment.  CT chest, abdomen and pelvis without contrast showed: Acute, mildly displaced fractures involving the left anterior 3rd through 6th ribs. No evidence of pneumothorax. New focal airspace opacity in the anterior right lower lobe, which may be due to pulmonary contusion or pneumonia.  No evidence of traumatic injury within the abdomen or pelvis.  Stable moderate hiatal  hernia.  Cholelithiasis. No radiographic evidence of cholecystitis.  Left forearm x-ray showed acute, obliquely oriented fracture deformity involving the distal diaphysis of the ulna.  Osteopenia Chest x-ray showed small right pleural effusion with patchy right basilar airspace opacity, atelectasis versus pneumonia Right knee x-ray showed no acute findings  Patient was started on IV antibiotics, breathing treatment, antitussives lidocaine patch pain management IV fluids and admitted for acute respiratory failure with hypoxia in the setting of rib fracture, pneumonia/consolidation/?contusion, left ulna fracture right knee pain and swelling, AKI, thalamic mass. Patient refused further MRI and workup, palliative care was consulted. Orthopedic advised outpatient hand surgery follow-up At this time she is medically stable, PT OT has recommended skilled nursing facility which patient's family wants to try. She will benefit with hospice care eventually after discharge from SNF  as patient essentially does not want to have any further workup and wants to stay comfortable     Subjective: Patient seen and examined She resting well No new complaints Does not feel well.  Assessment and Plan: Principal Problem:   CAP (community acquired pneumonia) Active Problems:   Left rib fracture   Left ulnar fracture   AKI (acute kidney injury)   Pain and swelling of right knee   Dehydration   Hypoalbuminemia due to protein-calorie malnutrition   Thalamic mass   History of atrial fibrillation   Essential tremor   Depression   Right leg swelling   Left distal ulna fracture: Seen by ortho>advised to continue splint, NWB, and follow-up with Dr. Orlan Leavens W/ hand surgery as OP.  Right knee pain suspect due to traumatic bursitis now resolving WBAT, continue pain management. Cont PTOT/  Acute respiratory failure  with hypoxia in the setting of pneumonia Community-acquired pneumonia Left rib fractures at  multiple sites: Hypoxia in the setting of left rib fractures, pneumonia.  Currently doing well on room air.  Will continue and complete course of antibiotics currently on ceftriaxone azithromycin, bronchodilators. COVID screen was negative.  Overall doing well does have chest pain and will continue pain management for rib fractures> cont Mucinex, IS, Flutter valve and supplemental oxygen, Lidocaine patch.  In ED Dr. Corliss Skains was consulted and stated patient does not need trauma admit at this time since fall was about a week ago and it was only rib fractures.  Concern for dysphagia/aspiration but patient does not want to go to through speech evaluation  Acute kidney injury Dehydration: Creatinine improved to 1.1 encourage oral hydration, Cont to avoid nephrotoxic medS Recent Labs    07/21/22 1155 07/22/22 0200 07/23/22 0159  BUN CREATININE 1.39* 1.46* 1.13*     Hypoalbuminemia possibly secondary to mild protein caloric malnutrition Albumin 3.3, protein supplement will be provided. RD consulted  Dysphagia/coughing team: does not want to pursue speech evaluation placed on regular diet  Thalamic mass Right thalamic mass (chronic) showed increase in size relative to 2022 and is currently at 3.0 cm:  Neurologist (Dr. Selina Cooley) was consulted and recommended MRI brain with and without contrast to ensure no underlying malignancy or concerning findings.  When discussed with the patient she does not want to proceed any further workup due to her age. PMT following   RLE swelling more than left: Duplex studies was negative for DVT.   Chronic atrial fibrillation rate controlled cont home eliquis  Depression: Mood is stable, on venlafaxine   Essential tremor:stable  Goals of care: Currently DNR patient wishes to be comfortable does not want to pursue any workup or MRI brain, palliative care has been consulted.  On discussion with family they want to pursue short-term rehab, also family  informed about hospice options.   DVT prophylaxis: apixaban (ELIQUIS) tablet 2.5 mg Start: 07/22/22 2000 SCDs Start: 07/21/22 1625 Code Status:   Code Status: DNR Family Communication: plan of care discussed with patient at bedside. Patient status is:  inpatient  because of respiratory failure for fractures Level of care: Med-Surg   Dispo: The patient is from: ALF            Anticipated disposition:  snf pending auth  Objective: Vitals last 24 hrs: Vitals:   07/23/22 1450 07/23/22 2000 07/24/22 0500 07/24/22 0805  BP: 118/66 (!) 145/78 131/66 128/74  Pulse: 79 84 (!) 57 63  Resp: Temp: (!) 97.5 F (36.4 C) 99.5 F (37.5 C) 98.1 F (36.7 C)   TempSrc: Oral Oral Oral   SpO2: 90% 90% 93% 92%  Weight:      Height:       Weight change:   Physical Examination: General exam: AA, weak,older appearing HEENT:Oral mucosa moist, Ear/Nose WNL grossly, dentition normal. Respiratory system: bilaterally diminished BS, no use of accessory muscle Cardiovascular system: S1 & S2 +, regular rate. Gastrointestinal system: Abdomen soft, NT,ND,BS+ Nervous System:Alert, awake, moving extremities and grossly nonfocal Extremities: LE ankle edema on left arm w/ splint+ Skin: No rashes,no icterus. MSK: weak/small  muscle bulk,tone, power    Medications reviewed:  Scheduled Meds:  acetaminophen  1,000 mg Oral BID   acidophilus  1 capsule Oral Daily   ALPRAZolam  0.5 mg Oral BID   apixaban  2.5 mg Oral BID   cholecalciferol  1,000 Units Oral Daily   cyanocobalamin  1,000 mcg Oral Daily   dextromethorphan-guaiFENesin  1 tablet Oral BID   feeding supplement (GLUCERNA SHAKE)  237 mL Oral TID BM   lidocaine  1 patch Transdermal Q24H   mirtazapine  7.5 mg Oral QHS   predniSONE  5 mg Oral Q breakfast   senna-docusate  1 tablet Oral Daily   tamsulosin  0.4 mg Oral Daily   traZODone  50 mg Oral QHS   venlafaxine XR  75 mg Oral Daily  Continuous Infusions:  sodium chloride 75 mL/hr at  07/22/22 1132   azithromycin 500 mg (07/23/22 1308)   cefTRIAXone (ROCEPHIN)  IV 2 g (07/23/22 1306)    Diet Order             Diet Heart Room service appropriate? No; Fluid consistency: Thin  Diet effective now                  Intake/Output Summary (Last 24 hours) at 07/24/2022 1129 Last data filed at 07/24/2022 0900 Gross per 24 hour  Intake 720 ml  Output 900 ml  Net -180 ml    Net IO Since Admission: -319.18 mL [07/24/22 1129]  Wt Readings from Last 3 Encounters:  07/21/22 60.8 kg  11/30/20 82 kg  07/04/20 72.6 kg    Unresulted Labs (From admission, onward)     Start     Ordered   07/23/22 0500  Basic metabolic panel  Daily,   R      07/22/22 0858   07/23/22 0500  CBC  Daily,   R      07/22/22 0858          Data Reviewed: I have personally reviewed following labs and imaging studies CBC: Recent Labs  Lab 07/21/22 1155 07/22/22 0200 07/23/22 0159  WBC 11.5* 8.8 9.2  HGB 8.8* 8.6* 8.0*  HCT 29.3* 27.2* 25.4*  MCV 87.7 85.3 84.9  PLT 202 180 190    Basic Metabolic Panel: Recent Labs  Lab 07/21/22 1155 07/22/22 0200 07/23/22 0159  NA 134* 134* 134*  K 3.5 3.2* 3.8  CL 98 100 104  CO2 25 24 24   GLUCOSE 157* 93 103*  BUN 21 19 17   CREATININE 1.39* 1.46* 1.13*  CALCIUM 8.8* 8.8* 8.9   PJK:DTOIZTIWP Creatinine Clearance: 31.8 mL/min (A) (by C-G formula based on SCr of 1.13 mg/dL (H)). Liver Function Tests: Recent Labs  Lab 07/21/22 1155  AST 19  ALT 11  ALKPHOS 105  BILITOT 0.6  PROT 7.1  ALBUMIN 3.3*   Antimicrobials: Anti-infectives (From admission, onward)    Start     Dose/Rate Route Frequency Ordered Stop   07/22/22 1400  cefTRIAXone (ROCEPHIN) 2 g in sodium chloride 0.9 % 100 mL IVPB        2 g 200 mL/hr over 30 Minutes Intravenous Every 24 hours 07/21/22 1629 07/27/22 1359   07/22/22 1400  azithromycin (ZITHROMAX) 500 mg in sodium chloride 0.9 % 250 mL IVPB        500 mg 250 mL/hr over 60 Minutes Intravenous Every 24 hours  07/21/22 1629 07/27/22 1359   07/21/22 1245  cefTRIAXone (ROCEPHIN) 1 g in sodium chloride 0.9 % 100 mL IVPB        1 g 200 mL/hr over 30 Minutes Intravenous  Once 07/21/22 1241 07/21/22 1632   07/21/22 1245  azithromycin (ZITHROMAX) 500 mg in sodium chloride 0.9 % 250 mL IVPB  500 mg 250 mL/hr over 60 Minutes Intravenous  Once 07/21/22 1241 07/21/22 1632      Culture/Microbiology No results found for: "SDES", "SPECREQUEST", "CULT", "REPTSTATUS"  Radiology Studies: No results found.   LOS: 3 days  Lanae Boast, MD [facility]  07/24/2022, 11:29 AM

## 2022-07-24 NOTE — Care Management Important Message (Signed)
Important Message  Patient Details  Name: [name] MRN: [id number] Date of Birth: 07-11-1929   Medicare Important Message Given:  Yes     Sherilyn Banker 07/24/2022, 11:41 AM

## 2022-07-24 NOTE — Progress Notes (Signed)
Palliative:  HPI: 89 y.o. female  with past medical history of paroxysmal atrial fibrillation on Eliquis, essential tremor, rheumatoid arthritis, anxiety, depression admitted from [facility] on 07/21/2022 with left-sided chest pain following a fall ~1 week ago now worsening with movement. Also with increasing shortness of breath, nonproductive cough and found to have community acquired pneumonia, left rib fracture, left ulna fracture, right knee swelling, acute kidney injury, increased size right thalamic mass. Rule out RLE DVT.   I met today at [facility]' bedside. She is sleeping soundly but awakens. She is a little disoriented upon awakening. I explained her situation and environment and she calmed back down. She has no complaints or concerns at this time. She agrees for transition to SNF rehab. I reassure her that we have been working with grandson, Susy Frizzle, to make sure we are caring for her best. She continues to cough after sitting up and drinking some water - likely having some level of aspiration but she is accepting of risks and wants to eat and drink what she wants. Will await insurance approval for SNF rehab - if denied I will discuss options with grandson further.   All questions/concerns addressed. Emotional support.   Exam: Alert, some disorientation especially when first awakening. No distress. Nonproductive cough. Breathing regular, unlabored. Abd soft. Moves all extremities.   Plan: - DNR - Quality of life is priority - SNF rehab with outpatient palliative  25 min  Yong Channel, NP Palliative Medicine Team Pager [phone] (Please see [email or web address] for schedule) Team Phone [phone]    Greater than 50%  of this time was spent counseling and coordinating care related to the above assessment and plan

## 2022-07-24 NOTE — TOC Progression Note (Addendum)
Transition of Care [facility]) - Progression Note    Patient Details  Name: [name] MRN: [id number] Date of Birth: [birth year]  Transition of Care [facility]) CM/SW Contact  Lorri Frederick, LCSW Phone Number: 07/24/2022, 9:42 AM  Clinical Narrative:   SNF auth remains pending in [place].  1315: Auth still pending.  [address] approved: [id number]: 3 days: 4/16-4/18.  [facility] can admit pt tomorrow.    Expected Discharge Plan: Skilled Nursing Facility Barriers to Discharge: SNF Pending bed offer  Expected Discharge Plan and Services In-house Referral: Clinical Social Work   Post Acute Care Choice:  (TBD) Living arrangements for the past 2 months:  ([facility])                                       Social Determinants of Health (SDOH) Interventions    Readmission Risk Interventions     No data to display

## 2022-07-25 DIAGNOSIS — J690 Pneumonitis due to inhalation of food and vomit: Secondary | ICD-10-CM

## 2022-07-25 DIAGNOSIS — E46 Unspecified protein-calorie malnutrition: Secondary | ICD-10-CM | POA: Diagnosis not present

## 2022-07-25 DIAGNOSIS — R2689 Other abnormalities of gait and mobility: Secondary | ICD-10-CM | POA: Diagnosis not present

## 2022-07-25 DIAGNOSIS — S2242XD Multiple fractures of ribs, left side, subsequent encounter for fracture with routine healing: Secondary | ICD-10-CM | POA: Diagnosis not present

## 2022-07-25 DIAGNOSIS — R1312 Dysphagia, oropharyngeal phase: Secondary | ICD-10-CM | POA: Diagnosis not present

## 2022-07-25 DIAGNOSIS — R2681 Unsteadiness on feet: Secondary | ICD-10-CM | POA: Diagnosis not present

## 2022-07-25 DIAGNOSIS — M6281 Muscle weakness (generalized): Secondary | ICD-10-CM | POA: Diagnosis not present

## 2022-07-25 DIAGNOSIS — R531 Weakness: Secondary | ICD-10-CM | POA: Diagnosis not present

## 2022-07-25 DIAGNOSIS — Z743 Need for continuous supervision: Secondary | ICD-10-CM | POA: Diagnosis not present

## 2022-07-25 DIAGNOSIS — J9601 Acute respiratory failure with hypoxia: Secondary | ICD-10-CM | POA: Diagnosis not present

## 2022-07-25 DIAGNOSIS — R262 Difficulty in walking, not elsewhere classified: Secondary | ICD-10-CM | POA: Diagnosis not present

## 2022-07-25 DIAGNOSIS — I4891 Unspecified atrial fibrillation: Secondary | ICD-10-CM | POA: Diagnosis not present

## 2022-07-25 DIAGNOSIS — I10 Essential (primary) hypertension: Secondary | ICD-10-CM | POA: Diagnosis not present

## 2022-07-25 DIAGNOSIS — S52202D Unspecified fracture of shaft of left ulna, subsequent encounter for closed fracture with routine healing: Secondary | ICD-10-CM | POA: Diagnosis not present

## 2022-07-25 DIAGNOSIS — S52622A Torus fracture of lower end of left ulna, initial encounter for closed fracture: Secondary | ICD-10-CM | POA: Diagnosis not present

## 2022-07-25 DIAGNOSIS — D649 Anemia, unspecified: Secondary | ICD-10-CM | POA: Diagnosis not present

## 2022-07-25 DIAGNOSIS — J189 Pneumonia, unspecified organism: Secondary | ICD-10-CM | POA: Diagnosis not present

## 2022-07-25 LAB — CBC
HCT: 26.1 % — ABNORMAL LOW (ref 36.0–46.0)
Hemoglobin: 7.9 g/dL — ABNORMAL LOW (ref 12.0–15.0)
MCH: 26.2 pg (ref 26.0–34.0)
MCHC: 30.3 g/dL (ref 30.0–36.0)
MCV: 86.7 fL (ref 80.0–100.0)
Platelets: 228 10*3/uL (ref 150–400)
RBC: 3.01 MIL/uL — ABNORMAL LOW (ref 3.87–5.11)
RDW: 19.8 % — ABNORMAL HIGH (ref 11.5–15.5)
WBC: 8.9 10*3/uL (ref 4.0–10.5)
nRBC: 0.4 % — ABNORMAL HIGH (ref 0.0–0.2)

## 2022-07-25 LAB — BASIC METABOLIC PANEL
Anion gap: 5 (ref 5–15)
BUN: 18 mg/dL (ref 8–23)
CO2: 24 mmol/L (ref 22–32)
Calcium: 8.9 mg/dL (ref 8.9–10.3)
Chloride: 105 mmol/L (ref 98–111)
Creatinine, Ser: 0.82 mg/dL (ref 0.44–1.00)
GFR, Estimated: >60 mL/min (ref >60)
Glucose, Bld: 92 mg/dL (ref 70–99)
Potassium: 3.9 mmol/L (ref 3.5–5.1)
Sodium: 134 mmol/L — ABNORMAL LOW (ref 135–145)

## 2022-07-25 MED ORDER — CEFADROXIL 500 MG PO CAPS: 500.0000 mg | ORAL_CAPSULE | Freq: Two times a day (BID) | ORAL | 0 refills | Status: AC

## 2022-07-25 MED ORDER — OXYCODONE-ACETAMINOPHEN 5-325 MG PO TABS: 1.0000 | ORAL_TABLET | Freq: Four times a day (QID) | ORAL | 0 refills | Status: AC | PRN

## 2022-07-25 MED ORDER — ACETAMINOPHEN 325 MG PO TABS: 650.0000 mg | ORAL_TABLET | Freq: Four times a day (QID) | ORAL | Status: AC | PRN

## 2022-07-25 MED ORDER — ALPRAZOLAM 0.5 MG PO TABS: 0.5000 mg | ORAL_TABLET | Freq: Two times a day (BID) | ORAL | 0 refills | Status: AC | PRN

## 2022-07-25 MED ORDER — LIDOCAINE 5 % EX PTCH: 1.0000 | MEDICATED_PATCH | CUTANEOUS | 0 refills | Status: AC

## 2022-07-25 MED ORDER — GLUCERNA SHAKE PO LIQD: 237.0000 mL | Freq: Three times a day (TID) | ORAL | 0 refills | Status: AC

## 2022-07-25 MED ORDER — DM-GUAIFENESIN ER 30-600 MG PO TB12: 1.0000 | ORAL_TABLET | Freq: Two times a day (BID) | ORAL | 0 refills | Status: AC

## 2022-07-25 NOTE — Progress Notes (Signed)
Palliative:  Chart reviewed. Plans for discharge to rehab today. Recommend outpatient palliative follow up.   No charge  Yong Channel, NP Palliative Medicine Team Pager [phone] (Please see [email or web address] for schedule) Team Phone [phone]

## 2022-07-25 NOTE — TOC Transition Note (Signed)
Transition of Care [facility]) - CM/SW Discharge Note   Patient Details  Name: [name] MRN: [id number] Date of Birth: 1929/07/02  Transition of Care [facility]) CM/SW Contact:  Lorri Frederick, LCSW Phone Number: 07/25/2022, 10:57 AM   Clinical Narrative:   Pt discharging to [facility], room 704. RN call report to [phone].   1000: CSW confirmed with [facility] that they can receive pt today.    Final next level of care: Skilled Nursing Facility Barriers to Discharge: Barriers Resolved   Patient Goals and CMS Choice      Discharge Placement                Patient chooses bed at: [facility], [place] Patient to be transferred to facility by: [facility] Name of family member notified: grandson Matt-message Patient and family notified of of transfer: 07/25/22  Discharge Plan and Services Additional resources added to the After Visit Summary for   In-house Referral: Clinical Social Work   Post Acute Care Choice:  (TBD)                               Social Determinants of Health (SDOH) Interventions     Readmission Risk Interventions     No data to display

## 2022-07-25 NOTE — Discharge Summary (Signed)
Physician Discharge Summary  Anna Rhoads [place] ZOX:[id number] DOB: 11-21-1929 DOA: 07/21/2022  PCP: Joycelyn Man, NP  Admit date: 07/21/2022 Discharge date: 07/25/2022 Recommendations for Outpatient Follow-up:  Follow up with PCP in 1 weeks-call for appointment Please obtain BMP/CBC in one week  Discharge Dispo: SNF [facility] Discharge Condition: Stable Code Status:   Code Status: DNR Diet recommendation:  Diet Order             Diet Heart Room service appropriate? No; Fluid consistency: Thin  Diet effective now                 Brief/Interim Summary: 89 y.o.f w/ significant of anxiety, depression, essential tremor, PAF on Eliquis presented to the EDfrom [facility] due to left-sided chest pain/shortness of breaths, reported he had a fall and subsequently having chest pain and left ankle started and has been worsening, also complaining of swelling and redness of right knee and was currently on antibiotics for this. Patient endorsed history of pleural effusions and states that she had drainage of the chest a few weeks ago. She denied fever, chills, palpitations, nausea, vomiting, abdominal pain, blood in stools.     In WJ:[id number] with respiratory rate in low 20s, pulse 50 bpm, BP 120/49, temperature 98.2 F, O2 sat was 97%.  Workup in the ED showed normocytic anemia, WBC 11.5, MCV 87.7, platelets 202.  BMP was normal sodium of 134, blood glucose 157, creatinine 1.39 (baseline creatinine at 0.8-0.9).  Troponin x 2 was negative.  Albumin 3.3  CT head and CT cervical spine without contrast showed 1. Discrete cystic area in the right thalamus has increased in size relative to 2022 prior and now measures 3.0 cm with some regional mass effect. Findings are atypical for cystic encephalomalacia and recommend MRI with contrast to further evaluate and exclude malignancy or enhancement. 2. No evidence of acute hemorrhage. 3. Chronic microvascular ischemic change.  CT cervical  spine:No evidence of acute fracture or traumatic malalignment.  CT chest, abdomen and pelvis without contrast showed: Acute, mildly displaced fractures involving the left anterior 3rd through 6th ribs. No evidence of pneumothorax. New focal airspace opacity in the anterior right lower lobe, which may be due to pulmonary contusion or pneumonia.  No evidence of traumatic injury within the abdomen or pelvis.  Stable moderate hiatal hernia.  Cholelithiasis. No radiographic evidence of cholecystitis.  Left forearm x-ray showed acute, obliquely oriented fracture deformity involving the distal diaphysis of the ulna.  Osteopenia Chest x-ray showed small right pleural effusion with patchy right basilar airspace opacity, atelectasis versus pneumonia Right knee x-ray showed no acute findings  Patient was started on IV antibiotics, breathing treatment, antitussives lidocaine patch pain management IV fluids and admitted for acute respiratory failure with hypoxia in the setting of rib fracture, pneumonia/consolidation/?contusion, left ulna fracture right knee pain and swelling, AKI, thalamic mass. Patient refused further MRI and workup, palliative care was consulted. Orthopedic advised outpatient hand surgery follow-up At this time she is medically stable, PT OT has recommended skilled nursing facility which patient's family wants to try. She will benefit with hospice care eventually after discharge from SNF  as patient essentially does not want to have any further workup and wants to stay comfortable. Skilled nursing patient has been approved and can be admitted 4/18     Discharge Diagnoses:  Principal Problem:   CAP (community acquired pneumonia) Active Problems:   Left rib fracture   Left ulnar fracture   AKI (acute kidney injury)  Pain and swelling of right knee   Dehydration   Hypoalbuminemia due to protein-calorie malnutrition   Thalamic mass   History of atrial fibrillation   Essential  tremor   Depression   Right leg swelling   Left distal ulna fracture: Seen by ortho>advised to continue splint, NWB, and follow-up with Dr. Orlan Leavens W/ hand surgery as OP.  Right knee pain suspect due to traumatic bursitis now resolving WBAT, continue pain management. Cont PTOT/  Acute respiratory failure with hypoxia in the setting of pneumonia Community-acquired pneumonia Left rib fractures at multiple sites: Hypoxia in the setting of left rib fractures, pneumonia.  Currently doing well on room air.  Will change to oral antibiotics to complete the course, encourage incentive spirometry, bronchodilators, lidocaine patch, Tylenol will add oxycodone for severe pain at the facility for pain management for rib fractures. Continue supplemental oxygen as needed at the facility  In ED Dr. Corliss Skains was consulted and stated patient does not need trauma admit at this time since fall was about a week ago and it was only rib fractures.  Concern for dysphagia/aspiration but patient does not want to go to through speech evaluation  Acute kidney injury Dehydration: It has resolved, encourage oral hydration  Recent Labs    07/21/22 1155 07/22/22 0200 07/23/22 0159 07/25/22 0244  BUN CREATININE 1.39* 1.46* 1.13* 0.82    Hypoalbuminemia possibly secondary to mild protein caloric malnutrition Albumin 3.3, protein supplement will be provided. RD consulted  Dysphagia/coughing team: does not want to pursue speech evaluation placed on regular diet  Thalamic mass Right thalamic mass (chronic) showed increase in size relative to 2022 and is currently at 3.0 cm:  Neurologist (Dr. Selina Cooley) was consulted and recommended MRI brain with and without contrast to ensure no underlying malignancy or concerning findings.  When discussed with the patient she does not want to proceed any further workup due to her age. PMT following   RLE swelling more than left: Duplex studies was negative for DVT.   Chronic  atrial fibrillation rate controlled cont home eliquis  Depression: Mood is stable, on venlafaxine   Essential tremor:stable  Goals of care: Currently DNR patient wishes to be comfortable does not want to pursue any workup or MRI brain, palliative care has been consulted.  On discussion with family they want to pursue short-term rehab, also family informed about hospice options.   Consults: Palliative care, orthopedics Subjective: Alert awake resting comfortably.  Discharge Exam: Vitals:   07/24/22 1958 07/25/22 0718  BP: 126/65 139/67  Pulse: 73 88  Resp: 18 18  Temp: 98.5 F (36.9 C)   SpO2: 95% 92%   General: Pt is alert, awake, not in acute distress Cardiovascular: RRR, S1/S2 +, no rubs, no gallops Respiratory: CTA bilaterally, no wheezing, no rhonchi Abdominal: Soft, NT, ND, bowel sounds + Extremities: no edema, no cyanosis  Discharge Instructions  Discharge Instructions     Discharge instructions   Complete by: As directed    Follow-up with Dr. Orlan Leavens from hand surgery  Please call call MD or return to ER for similar or worsening recurring problem that brought you to hospital or if any fever,nausea/vomiting,abdominal pain, uncontrolled pain, chest pain,  shortness of breath or any other alarming symptoms.  Please follow-up your doctor as instructed in a week time and call the office for appointment.  Please avoid alcohol, smoking, or any other illicit substance and maintain healthy habits including taking your regular medications as prescribed.  You  were cared for by a hospitalist during your hospital stay. If you have any questions about your discharge medications or the care you received while you were in the hospital after you are discharged, you can call the unit and ask to speak with the hospitalist on call if the hospitalist that took care of you is not available.  Once you are discharged, your primary care physician will handle any further medical issues.  Please note that NO REFILLS for any discharge medications will be authorized once you are discharged, as it is imperative that you return to your primary care physician (or establish a relationship with a primary care physician if you do not have one) for your aftercare needs so that they can reassess your need for medications and monitor your lab values   Increase activity slowly   Complete by: As directed    No wound care   Complete by: As directed       Allergies as of 07/25/2022       Reactions   Naproxen    Other reaction(s): HIVES   Penicillins    unknown   Prozac [fluoxetine]    Other reaction(s): HIVES        Medication List     STOP taking these medications    furosemide 40 MG tablet Commonly known as: LASIX   sulfamethoxazole-trimethoprim 400-80 MG tablet Commonly known as: BACTRIM       TAKE these medications    acetaminophen 325 MG tablet Commonly known as: TYLENOL Take 2 tablets (650 mg total) by mouth every 6 (six) hours as needed for mild pain (or Fever >/= 101). What changed:  medication strength how much to take when to take this reasons to take this   ALPRAZolam 0.5 MG tablet Commonly known as: XANAX Take 1 tablet (0.5 mg total) by mouth 2 (two) times daily as needed for up to 4 doses for anxiety. What changed:  when to take this reasons to take this   apixaban 2.5 MG Tabs tablet Commonly known as: ELIQUIS Take 2.5 mg by mouth 2 (two) times daily.   bisacodyl 10 MG suppository Commonly known as: DULCOLAX Place 10 mg rectally daily as needed for moderate constipation.   cefadroxil 500 MG capsule Commonly known as: DURICEF Take 1 capsule (500 mg total) by mouth 2 (two) times daily for 4 days.   cholecalciferol 25 MCG (1000 UNIT) tablet Commonly known as: VITAMIN D3 Take 1,000 Units by mouth daily.   cyanocobalamin 1000 MCG tablet Commonly known as: VITAMIN B12 Take 1,000 mcg by mouth daily.   dextromethorphan-guaiFENesin 30-600  MG 12hr tablet Commonly known as: MUCINEX DM Take 1 tablet by mouth 2 (two) times daily for 10 days.   feeding supplement (GLUCERNA SHAKE) Liqd Take 237 mLs by mouth 3 (three) times daily between meals.   guaifenesin 100 MG/5ML syrup Commonly known as: ROBITUSSIN Take 200 mg by mouth every 6 (six) hours as needed for cough.   lidocaine 5 % Commonly known as: LIDODERM Place 1 patch onto the skin daily for 14 days. Remove & Discard patch within 12 hours or as directed by MD   mirtazapine 7.5 MG tablet Commonly known as: REMERON Take 7.5 mg by mouth at bedtime.   oxyCODONE-acetaminophen 5-325 MG tablet Commonly known as: Percocet Take 1 tablet by mouth every 6 (six) hours as needed for up to 5 doses.   polyethylene glycol 17 g packet Commonly known as: MIRALAX / GLYCOLAX Take 17 g by mouth daily  as needed for mild constipation.   predniSONE 5 MG tablet Commonly known as: DELTASONE Take 5 mg by mouth daily with breakfast.   Probiotic-10 Ultimate Caps Take 1 capsule by mouth daily.   sennosides-docusate sodium 8.6-50 MG tablet Commonly known as: SENOKOT-S Take 1 tablet by mouth daily.   tamsulosin 0.4 MG Caps capsule Commonly known as: FLOMAX Take 0.4 mg by mouth.   traZODone 50 MG tablet Commonly known as: DESYREL Take 50 mg by mouth at bedtime.   venlafaxine XR 75 MG 24 hr capsule Commonly known as: EFFEXOR-XR Take 75 mg by mouth daily.        Contact information for follow-up providers     Bradly Bienenstock, MD Follow up in 1 week(s).   Specialty: Orthopedic Surgery Contact information: [address] [place] [place] [address] [phone]              Contact information for after-discharge care     Destination     [facility] Preferred SNF .   Service: Skilled Nursing Contact information: [address] [place] [address] [phone]                    Allergies   Allergen Reactions   Naproxen     Other reaction(s): HIVES   Penicillins     unknown   Prozac [Fluoxetine]     Other reaction(s): HIVES    The results of significant diagnostics from this hospitalization (including imaging, microbiology, ancillary and laboratory) are listed below for reference.    Microbiology: No results found for this or any previous visit (from the past 240 hour(s)).  Procedures/Studies: VAS [place] LOWER EXTREMITY VENOUS (DVT)  Result Date: 07/22/2022  Lower Venous DVT Study Patient Name:  ABIHA LUKEHART [facility]  Date of Exam:   07/22/2022 Medical Rec #: [id number]           Accession #:    [id number] Date of Birth: 1930/04/02            Patient Gender: F Patient Age:   55 years Exam Location:  [facility] Procedure:      VAS [place] LOWER EXTREMITY VENOUS (DVT) Referring Phys: Myrtha Mantis ADEFESO --------------------------------------------------------------------------------  Indications: Patient had a fall that resulted in pain, redness in right knee and a swollen ankle.  Comparison Study: No priors. Performing Technologist: Marilynne Halsted RDMS, RVT  Examination Guidelines: A complete evaluation includes B-mode imaging, spectral Doppler, color Doppler, and power Doppler as needed of all accessible portions of each vessel. Bilateral testing is considered an integral part of a complete examination. Limited examinations for reoccurring indications may be performed as noted. The reflux portion of the exam is performed with the patient in reverse Trendelenburg.  +---------+---------------+---------+-----------+----------+--------------+ RIGHT    CompressibilityPhasicitySpontaneityPropertiesThrombus Aging +---------+---------------+---------+-----------+----------+--------------+ CFV      Full           Yes      Yes                                 +---------+---------------+---------+-----------+----------+--------------+ SFJ      Full                                                         +---------+---------------+---------+-----------+----------+--------------+  FV Prox  Full                                                        +---------+---------------+---------+-----------+----------+--------------+ FV Mid   Full                                                        +---------+---------------+---------+-----------+----------+--------------+ FV DistalFull           Yes      Yes                                 +---------+---------------+---------+-----------+----------+--------------+ PFV      Full                                                        +---------+---------------+---------+-----------+----------+--------------+ POP      Full           Yes      Yes                                 +---------+---------------+---------+-----------+----------+--------------+ PTV      Full                                                        +---------+---------------+---------+-----------+----------+--------------+ PERO     Full                                                        +---------+---------------+---------+-----------+----------+--------------+   +----+---------------+---------+-----------+----------+--------------+ LEFTCompressibilityPhasicitySpontaneityPropertiesThrombus Aging +----+---------------+---------+-----------+----------+--------------+ CFV Full           Yes      Yes                                 +----+---------------+---------+-----------+----------+--------------+ SFJ Full                                                        +----+---------------+---------+-----------+----------+--------------+     Summary: RIGHT: - There is no evidence of deep vein thrombosis in the lower extremity.  - No cystic structure found in the popliteal fossa.  LEFT: - No evidence of common femoral vein obstruction.  *See table(s) above for measurements and observations. Electronically signed by Sherald Hess MD on 07/22/2022 at 1:07:05 PM.    Final  CT Head Wo Contrast  Result Date: 07/21/2022 CLINICAL DATA:  unclear if falls, on eliquis, diffuse body pain EXAM: CT HEAD WITHOUT CONTRAST CT CERVICAL SPINE WITHOUT CONTRAST TECHNIQUE: Multidetector CT imaging of the head and cervical spine was performed following the standard protocol without intravenous contrast. Multiplanar CT image reconstructions of the cervical spine were also generated. RADIATION DOSE REDUCTION: This exam was performed according to the departmental dose-optimization program which includes automated exposure control, adjustment of the mA and/or kV according to patient size and/or use of iterative reconstruction technique. COMPARISON:  CT head 11/30/2020. FINDINGS: CT HEAD FINDINGS Brain: Discrete cystic area in the right thalamus has increased in size relative to 2022 prior and now measures 3.0 cm with some regional mass effect. Patchy white matter hypodensities are nonspecific but compatible with chronic microvascular ischemic disease. No evidence of acute large vascular territory infarct. No evidence of acute hemorrhage. No hydrocephalus. Vascular: Calcific atherosclerosis. Skull: No acute fracture.  High left scalp contusion. Sinuses/Orbits: Clear sinuses.  No acute orbital findings. Other: Small left mastoid effusion. CT CERVICAL SPINE FINDINGS Alignment: Mild anterolisthesis of C3 on C4 and C4 on C5, likely degenerative given facet arthropathy at these levels. Otherwise, no substantial sagittal subluxation. Skull base and vertebrae: No evidence of acute fracture. Vertebral body heights are maintained. Osteopenia. Soft tissues and spinal canal: No prevertebral fluid or swelling. No visible canal hematoma. Disc levels: Severe multilevel degenerative change including facet and uncovertebral hypertrophy with varying degrees of neural foraminal stenosis. Upper chest: Please see concurrent CT of the chest for intrathoracic evaluation. Other:  Chronically enlarged thyroid. IMPRESSION: CTA head: 1. Discrete cystic area in the right thalamus has increased in size relative to 2022 prior and now measures 3.0 cm with some regional mass effect. Findings are atypical for cystic encephalomalacia and recommend MRI with contrast to further evaluate and exclude malignancy or enhancement. 2. No evidence of acute hemorrhage. 3. Chronic microvascular ischemic change. CT cervical spine: No evidence of acute fracture or traumatic malalignment. Electronically Signed   By: Feliberto Harts M.D.   On: 07/21/2022 14:39   CT Cervical Spine Wo Contrast  Result Date: 07/21/2022 CLINICAL DATA:  unclear if falls, on eliquis, diffuse body pain EXAM: CT HEAD WITHOUT CONTRAST CT CERVICAL SPINE WITHOUT CONTRAST TECHNIQUE: Multidetector CT imaging of the head and cervical spine was performed following the standard protocol without intravenous contrast. Multiplanar CT image reconstructions of the cervical spine were also generated. RADIATION DOSE REDUCTION: This exam was performed according to the departmental dose-optimization program which includes automated exposure control, adjustment of the mA and/or kV according to patient size and/or use of iterative reconstruction technique. COMPARISON:  CT head 11/30/2020. FINDINGS: CT HEAD FINDINGS Brain: Discrete cystic area in the right thalamus has increased in size relative to 2022 prior and now measures 3.0 cm with some regional mass effect. Patchy white matter hypodensities are nonspecific but compatible with chronic microvascular ischemic disease. No evidence of acute large vascular territory infarct. No evidence of acute hemorrhage. No hydrocephalus. Vascular: Calcific atherosclerosis. Skull: No acute fracture.  High left scalp contusion. Sinuses/Orbits: Clear sinuses.  No acute orbital findings. Other: Small left mastoid effusion. CT CERVICAL SPINE FINDINGS Alignment: Mild anterolisthesis of C3 on C4 and C4 on C5, likely  degenerative given facet arthropathy at these levels. Otherwise, no substantial sagittal subluxation. Skull base and vertebrae: No evidence of acute fracture. Vertebral body heights are maintained. Osteopenia. Soft tissues and spinal canal: No prevertebral fluid or swelling. No visible canal hematoma. Disc  levels: Severe multilevel degenerative change including facet and uncovertebral hypertrophy with varying degrees of neural foraminal stenosis. Upper chest: Please see concurrent CT of the chest for intrathoracic evaluation. Other: Chronically enlarged thyroid. IMPRESSION: CTA head: 1. Discrete cystic area in the right thalamus has increased in size relative to 2022 prior and now measures 3.0 cm with some regional mass effect. Findings are atypical for cystic encephalomalacia and recommend MRI with contrast to further evaluate and exclude malignancy or enhancement. 2. No evidence of acute hemorrhage. 3. Chronic microvascular ischemic change. CT cervical spine: No evidence of acute fracture or traumatic malalignment. Electronically Signed   By: Feliberto Harts M.D.   On: 07/21/2022 14:39   CT CHEST ABDOMEN PELVIS WO CONTRAST  Result Date: 07/21/2022 CLINICAL DATA:  Several falls this week. Right-sided chest and abdominal pain. EXAM: CT CHEST, ABDOMEN AND PELVIS WITHOUT CONTRAST TECHNIQUE: Multidetector CT imaging of the chest, abdomen and pelvis was performed following the standard protocol without IV contrast. RADIATION DOSE REDUCTION: This exam was performed according to the departmental dose-optimization program which includes automated exposure control, adjustment of the mA and/or kV according to patient size and/or use of iterative reconstruction technique. COMPARISON:  Chest CTA on 05/30/2022 FINDINGS: CT CHEST FINDINGS Cardiovascular: No evidence of mediastinal hematoma. No pericardial effusion. Aortic and coronary atherosclerotic calcification incidentally noted. Mediastinum/Nodes: No evidence of  hemorrhage or pneumomediastinum. Stable multinodular goiter. No pathologically enlarged lymph nodes identified on this noncontrast exam. Lungs/Pleura: Small right and tiny left pleural effusion show no significant change compared to prior exam. No evidence of pneumothorax. Right lower lobe atelectasis and scarring is again seen. New focal area of airspace opacity with air bronchograms is seen in the anterior right lower lobe, which may be due to pulmonary contusion or pneumonia. Stable elevation of right hemidiaphragm. Musculoskeletal: Acute, mildly displaced fractures are seen involving the left anterior 3rd through 6th ribs. Old compression fracture of the T11 vertebral body is unchanged in appearance. CT ABDOMEN PELVIS FINDINGS Hepatobiliary: No hepatic parenchymal injury or mass identified on this noncontrast exam. Layering sludge or tiny gallstones again noted. No evidence of cholecystitis or biliary ductal dilatation. Pancreas: No parenchymal abnormality identified on this noncontrast exam. Spleen: No evidence of parenchymal injury on this noncontrast exam. Adrenal/Urinary Tract: No hemorrhage or parenchymal injury identified on this noncontrast exam. Stable bilateral renal parenchymal scarring. No evidence of hydronephrosis. Distended urinary bladder noted. Stomach/Bowel: Moderate hiatal hernia again seen. No evidence of hemoperitoneum or free intraperitoneal air. Otherwise unremarkable. Vascular/Lymphatic: No evidence of retroperitoneal hemorrhage. Aortic atherosclerotic calcification incidentally noted. No pathologically enlarged lymph nodes identified. Reproductive: Prior hysterectomy noted. Adnexal regions are unremarkable in appearance. Other:  None. Musculoskeletal: No acute fractures or suspicious bone lesions identified. IMPRESSION: Acute, mildly displaced fractures involving the left anterior 3rd through 6th ribs. Evidence of pneumothorax. New focal airspace opacity in the anterior right lower lobe,  which may be due to pulmonary contusion or pneumonia. Stable small right and tiny left pleural effusions. No evidence of traumatic injury within the abdomen or pelvis. Stable moderate hiatal hernia. Cholelithiasis. No radiographic evidence of cholecystitis. Aortic Atherosclerosis (ICD10-I70.0). Electronically Signed   By: Danae Orleans M.D.   On: 07/21/2022 14:37   DG Knee 2 Views Right  Result Date: 07/21/2022 CLINICAL DATA:  Fall last week.  Right knee pain. EXAM: RIGHT KNEE - 2 VIEW COMPARISON:  07/02/2020 FINDINGS: No evidence of fracture, dislocation, or joint effusion. Enthesopathic changes are seen involving the patella. No other changes of arthropathy are seen.  Extensive peripheral vascular calcification noted. IMPRESSION: No acute findings. Electronically Signed   By: Danae Orleans M.D.   On: 07/21/2022 13:22   DG Forearm Left  Result Date: 07/21/2022 CLINICAL DATA:  Right knee and left arm pain status post fall EXAM: LEFT FOREARM - 2 VIEW COMPARISON:  None Available. FINDINGS: The bones appear diffusely osteopenic. There is an acute, obliquely oriented fracture deformity involving the distal diaphysis of the ulna. No additional fractures identified. Degenerative changes are identified at the basilar joint and radiocarpal joint. Chondrocalcinosis. IMPRESSION: 1. Acute, obliquely oriented fracture deformity involving the distal diaphysis of the ulna. 2. Osteopenia. Electronically Signed   By: Signa Kell M.D.   On: 07/21/2022 13:20   DG Chest 2 View  Result Date: 07/21/2022 CLINICAL DATA:  No chest pain after fall EXAM: CHEST - 2 VIEW COMPARISON:  05/30/2022 FINDINGS: Stable cardiomegaly. Aortic atherosclerosis. Small right pleural effusion with patchy right basilar airspace opacity. Left lung appears clear. No pneumothorax. Bones are demineralized. No definite fracture. IMPRESSION: Small right pleural effusion with patchy right basilar airspace opacity, atelectasis versus pneumonia.  Electronically Signed   By: Duanne Guess D.O.   On: 07/21/2022 12:26    Labs: BNP (last 3 results) No results for input(s): "BNP" in the last 8760 hours. Basic Metabolic Panel: Recent Labs  Lab 07/21/22 1155 07/22/22 0200 07/23/22 0159 07/25/22 0244  NA 134* 134* 134* 134*  K 3.5 3.2* 3.8 3.9  CL 98 100 104 105  CO2 25 24 24 24   GLUCOSE 157* 93 103* 92  BUN 21 19 17 18   CREATININE 1.39* 1.46* 1.13* 0.82  CALCIUM 8.8* 8.8* 8.9 8.9   Liver Function Tests: Recent Labs  Lab 07/21/22 1155  AST 19  ALT 11  ALKPHOS 105  BILITOT 0.6  PROT 7.1  ALBUMIN 3.3*   No results for input(s): "LIPASE", "AMYLASE" in the last 168 hours. No results for input(s): "AMMONIA" in the last 168 hours. CBC: Recent Labs  Lab 07/21/22 1155 07/22/22 0200 07/23/22 0159 07/25/22 0244  WBC 11.5* 8.8 9.2 8.9  HGB 8.8* 8.6* 8.0* 7.9*  HCT 29.3* 27.2* 25.4* 26.1*  MCV 87.7 85.3 84.9 86.7  PLT 202 180 190 228   nemia work up No results for input(s): "VITAMINB12", "FOLATE", "FERRITIN", "TIBC", "IRON", "RETICCTPCT" in the last 72 hours. Urinalysis    Component Value Date/Time   COLORURINE YELLOW 07/02/2020 1651   APPEARANCEUR CLOUDY (A) 07/02/2020 1651   LABSPEC 1.012 07/02/2020 1651   PHURINE 7.0 07/02/2020 1651   GLUCOSEU NEGATIVE 07/02/2020 1651   HGBUR SMALL (A) 07/02/2020 1651   BILIRUBINUR NEGATIVE 07/02/2020 1651   KETONESUR NEGATIVE 07/02/2020 1651   PROTEINUR 30 (A) 07/02/2020 1651   NITRITE NEGATIVE 07/02/2020 1651   LEUKOCYTESUR LARGE (A) 07/02/2020 1651   Sepsis Labs Recent Labs  Lab 07/21/22 1155 07/22/22 0200 07/23/22 0159 07/25/22 0244  WBC 11.5* 8.8 9.2 8.9   Microbiology No results found for this or any previous visit (from the past 240 hour(s)).  Time coordinating discharge: 35 minutes  SIGNED: Lanae Boast, MD  [facility] 07/25/2022, 10:33 AM  If 7PM-7AM, please contact night-coverage [email or web address]

## 2022-07-26 DIAGNOSIS — J189 Pneumonia, unspecified organism: Secondary | ICD-10-CM | POA: Diagnosis not present

## 2022-07-26 DIAGNOSIS — D649 Anemia, unspecified: Secondary | ICD-10-CM | POA: Diagnosis not present

## 2022-07-26 DIAGNOSIS — R262 Difficulty in walking, not elsewhere classified: Secondary | ICD-10-CM | POA: Diagnosis not present

## 2022-07-26 DIAGNOSIS — I4891 Unspecified atrial fibrillation: Secondary | ICD-10-CM | POA: Diagnosis not present

## 2022-08-02 DIAGNOSIS — S52622A Torus fracture of lower end of left ulna, initial encounter for closed fracture: Secondary | ICD-10-CM | POA: Diagnosis not present

## 2022-08-11 DIAGNOSIS — R9431 Abnormal electrocardiogram [ECG] [EKG]: Secondary | ICD-10-CM | POA: Diagnosis not present

## 2022-08-11 DIAGNOSIS — F419 Anxiety disorder, unspecified: Secondary | ICD-10-CM | POA: Diagnosis not present

## 2022-08-11 DIAGNOSIS — R531 Weakness: Secondary | ICD-10-CM | POA: Diagnosis not present

## 2022-08-11 DIAGNOSIS — F32A Depression, unspecified: Secondary | ICD-10-CM | POA: Diagnosis not present

## 2022-08-11 DIAGNOSIS — M069 Rheumatoid arthritis, unspecified: Secondary | ICD-10-CM | POA: Diagnosis not present

## 2022-08-11 DIAGNOSIS — Z79899 Other long term (current) drug therapy: Secondary | ICD-10-CM | POA: Diagnosis not present

## 2022-08-11 DIAGNOSIS — Z7901 Long term (current) use of anticoagulants: Secondary | ICD-10-CM | POA: Diagnosis not present

## 2022-08-11 DIAGNOSIS — I5032 Chronic diastolic (congestive) heart failure: Secondary | ICD-10-CM | POA: Diagnosis not present

## 2022-08-11 DIAGNOSIS — Z7952 Long term (current) use of systemic steroids: Secondary | ICD-10-CM | POA: Diagnosis not present

## 2022-08-11 DIAGNOSIS — J189 Pneumonia, unspecified organism: Secondary | ICD-10-CM | POA: Diagnosis not present

## 2022-08-11 DIAGNOSIS — F0390 Unspecified dementia without behavioral disturbance: Secondary | ICD-10-CM | POA: Diagnosis not present

## 2022-08-11 DIAGNOSIS — J206 Acute bronchitis due to rhinovirus: Secondary | ICD-10-CM | POA: Diagnosis not present

## 2022-08-11 DIAGNOSIS — E876 Hypokalemia: Secondary | ICD-10-CM | POA: Diagnosis not present

## 2022-08-11 DIAGNOSIS — R0902 Hypoxemia: Secondary | ICD-10-CM | POA: Diagnosis not present

## 2022-08-11 DIAGNOSIS — A419 Sepsis, unspecified organism: Secondary | ICD-10-CM | POA: Diagnosis not present

## 2022-08-11 DIAGNOSIS — D638 Anemia in other chronic diseases classified elsewhere: Secondary | ICD-10-CM | POA: Diagnosis not present

## 2022-08-11 DIAGNOSIS — J9601 Acute respiratory failure with hypoxia: Secondary | ICD-10-CM | POA: Diagnosis not present

## 2022-08-11 DIAGNOSIS — I4891 Unspecified atrial fibrillation: Secondary | ICD-10-CM | POA: Diagnosis not present

## 2022-08-11 DIAGNOSIS — G9341 Metabolic encephalopathy: Secondary | ICD-10-CM | POA: Diagnosis not present

## 2022-08-11 DIAGNOSIS — Z792 Long term (current) use of antibiotics: Secondary | ICD-10-CM | POA: Diagnosis not present

## 2022-09-07 DEATH — deceased
